# Patient Record
Sex: Male | Born: 1975 | Hispanic: No | Marital: Married | Smoking: Never smoker
Health system: Southern US, Community
[De-identification: ages and names within clinical notes are randomized; demographics above are authoritative.]

---

## 1998-08-01 ENCOUNTER — Emergency Department (HOSPITAL_COMMUNITY): Admission: EM | Admit: 1998-08-01 | Discharge: 1998-08-01 | Payer: Self-pay | Admitting: Emergency Medicine

## 1998-08-01 ENCOUNTER — Encounter: Payer: Self-pay | Admitting: Emergency Medicine

## 1998-08-07 ENCOUNTER — Emergency Department (HOSPITAL_COMMUNITY): Admission: EM | Admit: 1998-08-07 | Discharge: 1998-08-07 | Payer: Self-pay | Admitting: Emergency Medicine

## 2016-01-28 ENCOUNTER — Ambulatory Visit (INDEPENDENT_AMBULATORY_CARE_PROVIDER_SITE_OTHER): Payer: Self-pay | Admitting: Family Medicine

## 2016-01-28 VITALS — BP 118/72 | HR 50 | Temp 98.4°F | Resp 16 | Ht 68.0 in | Wt 178.0 lb

## 2016-01-28 DIAGNOSIS — Z113 Encounter for screening for infections with a predominantly sexual mode of transmission: Secondary | ICD-10-CM

## 2016-01-28 DIAGNOSIS — Z1389 Encounter for screening for other disorder: Secondary | ICD-10-CM

## 2016-01-28 DIAGNOSIS — Z1383 Encounter for screening for respiratory disorder NEC: Secondary | ICD-10-CM

## 2016-01-28 DIAGNOSIS — Z136 Encounter for screening for cardiovascular disorders: Secondary | ICD-10-CM

## 2016-01-28 DIAGNOSIS — Z130 Encounter for screening for diseases of the blood and blood-forming organs and certain disorders involving the immune mechanism: Secondary | ICD-10-CM

## 2016-01-28 DIAGNOSIS — Z1329 Encounter for screening for other suspected endocrine disorder: Secondary | ICD-10-CM

## 2016-01-28 DIAGNOSIS — Z0000 Encounter for general adult medical examination without abnormal findings: Secondary | ICD-10-CM

## 2016-01-28 LAB — CBC
HCT: 49.7 % (ref 38.5–50.0)
Hemoglobin: 17.3 g/dL — ABNORMAL HIGH (ref 13.2–17.1)
MCH: 29.9 pg (ref 27.0–33.0)
MCHC: 34.8 g/dL (ref 32.0–36.0)
MCV: 85.8 fL (ref 80.0–100.0)
MPV: 11.2 fL (ref 7.5–12.5)
Platelets: 201 10*3/uL (ref 140–400)
RBC: 5.79 MIL/uL (ref 4.20–5.80)
RDW: 13.6 % (ref 11.0–15.0)
WBC: 4.2 10*3/uL (ref 3.8–10.8)

## 2016-01-28 LAB — COMPREHENSIVE METABOLIC PANEL
ALT: 53 U/L — ABNORMAL HIGH (ref 9–46)
AST: 23 U/L (ref 10–40)
Albumin: 4.6 g/dL (ref 3.6–5.1)
Alkaline Phosphatase: 58 U/L (ref 40–115)
BUN: 17 mg/dL (ref 7–25)
CO2: 29 mmol/L (ref 20–31)
Calcium: 9.3 mg/dL (ref 8.6–10.3)
Chloride: 103 mmol/L (ref 98–110)
Creat: 0.96 mg/dL (ref 0.60–1.35)
Glucose, Bld: 89 mg/dL (ref 65–99)
Potassium: 4.4 mmol/L (ref 3.5–5.3)
Sodium: 140 mmol/L (ref 135–146)
Total Bilirubin: 0.5 mg/dL (ref 0.2–1.2)
Total Protein: 7.1 g/dL (ref 6.1–8.1)

## 2016-01-28 LAB — POCT URINALYSIS DIP (MANUAL ENTRY)
Bilirubin, UA: NEGATIVE
Blood, UA: NEGATIVE
Glucose, UA: NEGATIVE
Ketones, POC UA: NEGATIVE
Leukocytes, UA: NEGATIVE
Nitrite, UA: NEGATIVE
Protein Ur, POC: NEGATIVE
Spec Grav, UA: 1.01
Urobilinogen, UA: 0.2
pH, UA: 7

## 2016-01-28 LAB — TSH: TSH: 1.54 mIU/L (ref 0.40–4.50)

## 2016-01-28 LAB — LIPID PANEL
Cholesterol: 185 mg/dL (ref <200)
HDL: 49 mg/dL (ref >40)
LDL Cholesterol: 115 mg/dL — ABNORMAL HIGH (ref <100)
Total CHOL/HDL Ratio: 3.8 Ratio (ref <5.0)
Triglycerides: 104 mg/dL (ref <150)
VLDL: 21 mg/dL (ref <30)

## 2016-01-28 NOTE — Progress Notes (Signed)
By signing my name below, I, Mesha Guinyard, attest that this documentation has been prepared under the direction and in the presence of Norberto SorensonEva Shaw, MD.  Electronically Signed: Arvilla MarketMesha Guinyard, Medical Scribe. 01/28/16. 10:12 AM.  Subjective:    Patient ID: Robert Jordan, male    DOB: 04-11-1975, 40 y.o.   MRN: [id number]  HPI Chief Complaint  Patient presents with  . Annual Exam  . Labs Only    HPI Comments: Robert HearingRamon Arreola Levitan is a 40 y.o. male who presents to the [facility] for his complete physical. Pt's native language is Spanish so he brought in a friend to translate for him.  Pt takes OTC fish-oil supplements. Denies trouble urinating and FHx of medical problems. Pt is fasting.  Nocturia: 3x and mentions he drinks a lot of water before bed.  Immunizations: Reports his tetanus shot was done within the past 10 years and deferred the flu shot.  There is no immunization history on file for this patient.  Exercise: He exercises. Denies experiencing chest pain, and palpitations.  Depression Screening: Depression screen [facility] 2/9 01/28/2016  Decreased Interest 0  Down, Depressed, Hopeless 0  PHQ - 2 Score 0    There are no active problems to display for this patient.  No past medical history on file. No past surgical history on file. No Known Allergies Prior to Admission medications   Not on File   Social History   Social History  . Marital status: Single    Spouse name: N/A  . Number of children: N/A  . Years of education: N/A   Occupational History  . Not on file.   Social History Main Topics  . Smoking status: Never Smoker  . Smokeless tobacco: Not on file  . Alcohol use Not on file  . Drug use: Unknown  . Sexual activity: Not on file   Other Topics Concern  . Not on file   Social History Narrative  . No narrative on file   No family history on file.  Review of Systems  13 point ROS was negative. Objective:  Physical Exam   Constitutional: He appears well-developed and well-nourished. No distress.  HENT:  Head: Normocephalic and atraumatic.  Right Ear: Tympanic membrane normal.  Left Ear: Tympanic membrane normal.  Nose: Nose normal.  Mouth/Throat: Oropharynx is clear and moist.  Eyes: Conjunctivae are normal.  Neck: Neck supple. No thyroid mass and no thyromegaly present.  Cardiovascular: Normal rate, regular rhythm, S1 normal, S2 normal and normal heart sounds.  Exam reveals no gallop and no friction rub.   No murmur heard. Pulses:      Dorsalis pedis pulses are 2+ on the right side, and 2+ on the left side.  Pulmonary/Chest: Effort normal and breath sounds normal. No respiratory distress. He has no wheezes. He has no rales.  Abdominal: Soft. Bowel sounds are normal. He exhibits no distension. There is no hepatosplenomegaly. There is no tenderness.  Lymphadenopathy:    He has no cervical adenopathy.  Neurological: He is alert.  Reflex Scores:      Patellar reflexes are 2+ on the right side and 2+ on the left side.      Achilles reflexes are 2+ on the right side and 2+ on the left side. Skin: Skin is warm and dry.  Psychiatric: He has a normal mood and affect. His behavior is normal.  Nursing note and vitals reviewed.  BP 118/72   Pulse (!) 50  Temp 98.4 F (36.9 C) (Oral)   Resp 16   Ht 5\' 8"  (1.727 m)   Wt 178 lb (80.7 kg)   SpO2 98%   BMI 27.06 kg/m    Results for orders placed or performed in visit on 01/28/16  POCT urinalysis dipstick  Result Value Ref Range   Color, UA yellow yellow   Clarity, UA clear clear   Glucose, UA negative negative   Bilirubin, UA negative negative   Ketones, POC UA negative negative   Spec Grav, UA 1.010    Blood, UA negative negative   pH, UA 7.0    Protein Ur, POC negative negative   Urobilinogen, UA 0.2    Nitrite, UA Negative Negative   Leukocytes, UA Negative Negative   Assessment & Plan:   1. Annual physical exam   2. Routine screening for  STI (sexually transmitted infection)   3. Screening for cardiovascular, respiratory, and genitourinary diseases - declines ekg  4. Screening for deficiency anemia   5. Screening for thyroid disorder   6. Screening for endocrine disorder - pt requests testosterone screen.  Declines flu shot  Orders Placed This Encounter  Procedures  . GC/Chlamydia Probe Amp  . CBC  . Comprehensive metabolic panel    Order Specific Question:   Has the patient fasted?    Answer:   Yes  . TSH  . Lipid panel    Order Specific Question:   Has the patient fasted?    Answer:   Yes  . Testosterone Total,Free,Bio, Males  . RPR  . HIV antibody  . Hepatitis C Antibody  . Care order/instruction:    AVS and GO    Scheduling Instructions:     AVS and GO  . POCT urinalysis dipstick    I personally performed the services described in this documentation, which was scribed in my presence. The recorded information has been reviewed and considered, and addended by me as needed.   Norberto SorensonEva Shaw, M.D.  [facility]  [place] [address] [place], [place] [address] [phone] phone [phone] fax  01/28/16 10:58 PM

## 2016-01-28 NOTE — Patient Instructions (Addendum)
IF you received an x-ray today, you will receive an invoice from [facility]. Please contact [facility] at [phone] with questions or concerns regarding your invoice.   IF you received labwork today, you will receive an invoice from [facility]. Please contact [facility] at [phone] with questions or concerns regarding your invoice.   Our billing staff will not be able to assist you with questions regarding bills from these companies.  You will be contacted with the lab results as soon as they are available. The fastest way to get your results is to activate your My Chart account. Instructions are located on the last page of this paperwork. If you have not heard from us regarding the results in 2 weeks, please contact this office.     Mantenimiento de Engineer, civil (consulting)la salud en los hombres (Health Maintenance, Male) Un estilo de vida saludable y los cuidados preventivos son importantes para la salud y Counsellorel bienestar. Pregntele al mdico cul es el cronograma de exmenes peridicos adecuado para usted. QU DEBO SABER SOBRE EL PESO Y LA DIETA?  Consuma una dieta saludable   Coma muchas verduras, frutas, cereales integrales, productos lcteos con bajo contenido de grasa y Associate Professorprotenas magras.  No consuma muchos alimentos de alto contenido de grasas slidas, azcares agregados o sal. Mantenga un peso saludable  La actividad fsica habitual puede ayudarlo a Baristaalcanzar o mantener un peso saludable. Deber hacer lo siguiente:  Realizar al menos 150minutos de actividad fsica por semana. El ejercicio debe aumentar la frecuencia cardaca y Development worker, international aidprovocar la transpiracin (ejercicio de Monmouthintensidad moderada).  Hacer ejercicios de entrenamiento de fuerza por lo Rite Aidmenos dos veces por semana. Controlarse los niveles de colesterol y lpidos en la sangre   Hgase anlisis de sangre para controlar los lpidos y el colesterol cada 5aos a partir de los 35aos. Si tiene un  riesgo alto de Warehouse managertener cardiopatas coronarias, debe comenzar a Assuranthacerse anlisis de Edmundsonsangre a los [place]20aos. Es posible que Insurance underwriternecesite controlar los niveles de colesterol con mayor frecuencia si:  Sus niveles de lpidos y colesterol son altos.  Es mayor de 50aos.  Tiene un riesgo alto de tener cardiopatas coronarias. QU DEBO SABER SOBRE LAS PRUEBAS DE DETECCIN DEL CNCER? Muchos tipos de cncer se pueden detectar de manera temprana y a menudo prevenirse. Cncer de pulmn   Debe someterse a pruebas de deteccin de cncer de pulmn todos los aos en los siguientes casos:  Si fuma actualmente y lo ha hecho durante por lo menos 30aos.  Si fue fumador que dej el hbito en el trmino de los ltimos 15aos.  Hable con el mdico sobre las opciones en relacin con los estudios de deteccin, cundo debe comenzar a Actuaryhacrselos y con Engineer, structuralqu frecuencia. Cncer colorrectal   Generalmente, las pruebas de deteccin habituales del cncer colorrectal comienzan a los 50aos y deben repetirse cada 5 a 10aos hasta los 75aos. Es posible que tenga que hacerse las pruebas con mayor frecuencia si se detectan formas tempranas de plipos precancerosos o pequeos bultos. Sin embargo, el mdico podr aconsejarle que lo haga antes, si tiene factores de riesgo para el cncer de colon.  El mdico puede recomendarle que use kits de prueba caseros para Recruitment consultanthallar sangre oculta en la materia fecal.  Se puede usar una pequea cmara en el extremo de un tubo para examinar el colon (sigmoidoscopia o colonoscopia). Este estudio PPG Industriesdetecta las formas ms tempranas de Building services engineercncer colorrectal. Cncer de prstata y de testculo   En funcin de la  edad y del estado de salud general, el mdico puede realizarle determinados estudios de deteccin del cncer de prstata y de testculo.  Hable con el mdico sobre cualquier sntoma o acerca de las inquietudes que tenga sobre el cncer de prstata o de testculo. Cncer de piel   Revise la  piel de la cabeza a los pies con regularidad.  Informe al mdico si aparecen nuevos lunares o si nota cambios en los que ya tiene, especialmente en estos casos:  Si hay un cambio en el tamao, la forma o el color del lunar.  Si tiene un lunar que es ms grande que el tamao de una goma de Paramediclpiz.  Siempre use pantalla solar. Aplquese pantalla solar de Barth Kirksmanera generosa y repetida a lo largo del Futures traderda.  Use mangas y Automatic Datapantalones largos, un sombrero de ala ancha y gafas para el sol cuando est al Guadalupe Dawnaire libre, para protegerse. QU DEBO SABER SOBRE LAS CARDIOPATAS CORONARIAS, LA DIABETES Y LA HIPERTENSIN ARTERIAL?  Si usted tiene entre 18 y 39aos, debe medirse la presin arterial cada 3a 5aos. Si usted tiene 40aos o ms, debe medirse la presin arterial Allied Waste Industriestodos los aos. Debe medirse la presin arterial dos veces: una vez cuando est en un hospital o una clnica y la otra vez cuando est en otro sitio. Registre el promedio de Johnson Controlslas dos mediciones. Para controlar su presin arterial cuando no est en un hospital o Paulita Cradleuna clnica, puede usar lo siguiente:  Neomia DearUna mquina automtica para medir la presin arterial en una farmacia.  Un monitor para medir la presin arterial en el hogar.  Hable con el mdico Lowe's Companiessobre los valores ideales de la presin arterial.  Si tiene entre 45 y 79aos, consltele al mdico si debe tomar aspirina para evitar las cardiopatas coronarias.  Hgase anlisis habituales de deteccin de la diabetes; para ello, contrlese la glucemia en ayunas.  Si su peso es normal y tiene un bajo riesgo de padecer diabetes, realcese este anlisis cada tres aos despus de los 45aos.  Si tiene sobrepeso y un alto riesgo de padecer diabetes, considere someterse a este anlisis antes o con mayor frecuencia.  Para los hombres que tienen entre 65 y 5875aos, y son o han sido fumadores, se recomienda un nico estudio con ecografa para Engineer, manufacturingdetectar un aneurisma de aorta abdominal (AAA). QU DEBO  SABER SOBRE LA PREVENCIN DE LAS INFECCIONES? HepatitisB  Si tiene un riesgo ms alto de Primary school teachercontraer hepatitis B, debe someterse a un examen de deteccin de este virus. Hable con el mdico para determinar si corre riesgo de tener una infeccin por hepatitisB. Hepatitis C  Se recomienda un anlisis de Flushingsangre para:  Todos los que nacieron entre 1945 y [phone].  Todas las personas que tengan un riesgo de haber contrado hepatitis C. Enfermedades de transmisin sexual (ETS)   Debe realizarse pruebas de Airline pilotdeteccin de las ETS todos los aos, incluidas la gonorrea y la clamidia, en estos casos:  Es sexualmente activo y es menor de [place]24aos.  Es mayor de 24aos, y Public affairs consultantel mdico le informa que corre riesgo de tener este tipo de infecciones.  La actividad sexual ha cambiado desde que le hicieron la ltima prueba de deteccin y tiene un riesgo mayor de Warehouse managertener clamidia o Copygonorrea. Pregntele al mdico si usted tiene riesgo.  Consulte a su mdico para saber si tiene un alto riesgo de infectarse por el VIH. El mdico puede recomendarle un medicamento de venta con receta para ayudar a evitar la infeccin por el VIH. QU  MS PUEDO HACER?  Realcese los estudios de rutina de la salud, dentales y de Wellsite geologist.  Mantngase al da con las vacunas (inmunizaciones).  No consuma ningn producto que contenga tabaco, lo que incluye cigarrillos, tabaco de Theatre manager y Administrator, Civil Service. Si necesita ayuda para dejar de fumar, consulte al mdico.  Limite el consumo de alcohol a no ms de por da. BorgWarner a 12 onzas de cerveza, 5onzas de vino o 1onzas de bebidas alcohlicas de alta graduacin.  No consuma drogas.  No comparta agujas.  Solicite ayuda a su mdico si necesita apoyo o informacin para abandonar las drogas.  Informe a su mdico si a menudo se siente deprimido.  Notifique a su mdico si alguna vez ha sido vctima de abuso o si no se siente seguro en su hogar. Esta informacin  no tiene Theme park manager el consejo del mdico. Asegrese de hacerle al mdico cualquier pregunta que tenga. Document Released: 08/19/2007 Document Revised: 03/13/2014 Document Reviewed: 11/24/2014 Elsevier Interactive Patient Education  2017 [employer].

## 2016-01-29 LAB — GC/CHLAMYDIA PROBE AMP
CT Probe RNA: NOT DETECTED
GC Probe RNA: NOT DETECTED

## 2016-01-29 LAB — HEPATITIS C ANTIBODY: HCV AB: NEGATIVE

## 2016-01-29 LAB — RPR

## 2016-01-29 LAB — HIV ANTIBODY (ROUTINE TESTING W REFLEX): HIV 1&2 Ab, 4th Generation: NONREACTIVE

## 2016-01-31 LAB — TESTOSTERONE TOTAL,FREE,BIO, MALES
Albumin: 4.6 g/dL (ref 3.6–5.1)
Sex Hormone Binding: 50 nmol/L (ref 10–50)
Testosterone, Bioavailable: 66.5 ng/dL — ABNORMAL LOW (ref 110.0–575.0)
Testosterone, Free: 31.7 pg/mL — ABNORMAL LOW (ref 46.0–224.0)
Testosterone: 352 ng/dL (ref 250–827)

## 2016-02-04 ENCOUNTER — Telehealth: Payer: Self-pay | Admitting: Emergency Medicine

## 2016-02-04 NOTE — Telephone Encounter (Signed)
Patient came in for lab results and Dr. Clelia CroftShaw is out of the office this week.  Per Maralyn SagoSarah after reviewing- Advised results look good and no treatment needed at this time.  Advised- Dr. Clelia CroftShaw will address low testosterone levels  Verbalized understanding and copy given

## 2016-02-14 ENCOUNTER — Encounter: Payer: Self-pay | Admitting: Family Medicine

## 2016-09-15 ENCOUNTER — Encounter: Payer: Self-pay | Admitting: Physician Assistant

## 2016-09-15 ENCOUNTER — Ambulatory Visit (INDEPENDENT_AMBULATORY_CARE_PROVIDER_SITE_OTHER): Payer: Self-pay | Admitting: Physician Assistant

## 2016-09-15 VITALS — BP 121/75 | HR 80 | Temp 98.2°F | Resp 18 | Ht 68.0 in | Wt 180.2 lb

## 2016-09-15 DIAGNOSIS — J029 Acute pharyngitis, unspecified: Secondary | ICD-10-CM

## 2016-09-15 LAB — POCT RAPID STREP A (OFFICE): Rapid Strep A Screen: NEGATIVE

## 2016-09-15 MED ORDER — FIRST-DUKES MOUTHWASH MT SUSP: 5.0000 mL | Freq: Four times a day (QID) | OROMUCOSAL | 0 refills | Status: DC | PRN

## 2016-09-15 NOTE — Progress Notes (Signed)
   Vickki HearingRamon Arreola Abila  MRN: [id number] DOB: 1975-07-21  PCP: Patient, No Pcp Per  Subjective:  Pt is a 41 year old male who presents to clinic for sore throat x 1 day. Painful to swallow. 6/10 pain.  Endorses fatigue.  He didn't sleep well last night due to throat pain. He tried NyQuil, helped some.  Denies fever, chills, cough, n/v, diaphoresis, difficulty breathing.   Review of Systems  Constitutional: Positive for fatigue. Negative for chills, diaphoresis and fever.  HENT: Positive for sore throat. Negative for congestion, ear discharge, ear pain, postnasal drip and rhinorrhea.   Respiratory: Negative for cough.   Skin: Negative for rash.    There are no active problems to display for this patient.   No current outpatient prescriptions on file prior to visit.   No current facility-administered medications on file prior to visit.     No Known Allergies   Objective:  BP 121/75 (BP Location: Right Arm, Patient Position: Sitting, Cuff Size: Large)   Pulse 80   Temp 98.2 F (36.8 C) (Oral)   Resp 18   Ht 5\' 8"  (1.727 m)   Wt 180 lb 3.2 oz (81.7 kg)   SpO2 97%   BMI 27.40 kg/m   Physical Exam  Constitutional: He is oriented to person, place, and time and well-developed, well-nourished, and in no distress. No distress.  HENT:  Right Ear: Tympanic membrane is scarred.  Left Ear: Tympanic membrane normal.  Mouth/Throat: Uvula is midline and mucous membranes are normal. Posterior oropharyngeal erythema present. No oropharyngeal exudate or posterior oropharyngeal edema.  Cardiovascular: Normal rate, regular rhythm and normal heart sounds.   Neurological: He is alert and oriented to person, place, and time. GCS score is 15.  Skin: Skin is warm and dry.  Psychiatric: Mood, memory, affect and judgment normal.  Vitals reviewed.  Results for orders placed or performed in visit on 09/15/16  POCT rapid strep A  Result Value Ref Range   Rapid Strep A Screen Negative Negative     Assessment and Plan :  1. Acute pharyngitis, unspecified etiology 2. Sore throat - POCT rapid strep A - Culture, Group A Strep - Diphenhyd-Hydrocort-Nystatin (FIRST-DUKES MOUTHWASH) SUSP; Use as directed 5 mLs in the mouth or throat every 6 (six) hours as needed. Gargle and spit out  Dispense: 237 mL; Refill: 0 - Rapid strep is negative. Culture is pending. Suspect viral pharyngitis. Will treat supportively. RTC in 5-7 days if no improvement.   Marco CollieWhitney Aimy Sweeting, PA-C  [facility] 09/15/2016 12:27 PM

## 2016-09-15 NOTE — Patient Instructions (Addendum)
Drink warm tea with honey. See below for home care tips.  400-681m Ibuprofen every 6 hours for pain.  Gargle and spit Duke's magic mouthwash.  Come back if you are not better in 5-7 days.   Thank you for coming in today. I hope you feel we met your needs.  Feel free to call [facility] if you have any questions or further requests.  Please consider signing up for MyChart if you do not already have it, as this is a great way to communicate with me.  Best,  Whitney McVey, PA-C   Sore Throat When you have a sore throat, your throat may:  Hurt.  Burn.  Feel irritated.  Feel scratchy.  Many things can cause a sore throat, including:  An infection.  Allergies.  Dryness in the air.  Smoke or pollution.  Gastroesophageal reflux disease (GERD).  A tumor.  A sore throat can be the first sign of another sickness. It can happen with other problems, like coughing or a fever. Most sore throats go away without treatment. Follow these instructions at home:  Take over-the-counter medicines only as told by your doctor.  Drink enough fluids to keep your pee (urine) clear or pale yellow.  Rest when you feel you need to.  To help with pain, try: ? Sipping warm liquids, such as broth, herbal tea, or warm water. ? Eating or drinking cold or frozen liquids, such as frozen ice pops. ? Gargling with a salt-water mixture 3-4 times a day or as needed. To make a salt-water mixture, add -1 tsp of salt in 1 cup of warm water. Mix it until you cannot see the salt anymore. ? Sucking on hard candy or throat lozenges. ? Putting a cool-mist humidifier in your bedroom at night. ? Sitting in the bathroom with the door closed for 5-10 minutes while you run hot water in the shower.  Do not use any tobacco products, such as cigarettes, chewing tobacco, and e-cigarettes. If you need help quitting, ask your doctor. Contact a doctor if:  You have a fever for more than 2-3 days.  You keep having symptoms  for more than 2-3 days.  Your throat does not get better in 7 days.  You have a fever and your symptoms suddenly get worse. Get help right away if:  You have trouble breathing.  You cannot swallow fluids, soft foods, or your saliva.  You have swelling in your throat or neck that gets worse.  You keep feeling like you are going to throw up (vomit).  You keep throwing up. This information is not intended to replace advice given to you by your health care provider. Make sure you discuss any questions you have with your health care provider. Document Released: 11/30/2007 Document Revised: 10/17/2015 Document Reviewed: 12/11/2014 Elsevier Interactive Patient Education  2018 [employer]  IF you received an x-ray today, you will receive an invoice from [facility]. Please contact [facility] at [phone] questions or concerns regarding your invoice.   IF you received labwork today, you will receive an invoice from [facility] Please contact [facility] at [phone] questions or concerns regarding your invoice.   Our billing staff will not be able to assist you with questions regarding bills from these companies.  You will be contacted with the lab results as soon as they are available. The fastest way to get your results is to activate your My Chart account. Instructions are located on the last page of this paperwork. If  you have not heard from [place] regarding the results in 2 weeks, please contact this office.

## 2016-09-18 LAB — CULTURE, GROUP A STREP

## 2016-09-18 LAB — PLEASE NOTE

## 2016-09-18 NOTE — Progress Notes (Signed)
Spoke with pt on phone. He is feeling better. Will not treat.

## 2017-06-10 ENCOUNTER — Encounter (HOSPITAL_COMMUNITY): Payer: Self-pay

## 2017-06-10 ENCOUNTER — Other Ambulatory Visit: Payer: Self-pay

## 2017-06-10 ENCOUNTER — Emergency Department (HOSPITAL_COMMUNITY)
Admission: EM | Admit: 2017-06-10 | Discharge: 2017-06-10 | Disposition: A | Discharge status: Home / Self Care | Payer: Self-pay | Attending: Emergency Medicine | Admitting: Emergency Medicine

## 2017-06-10 DIAGNOSIS — R1011 Right upper quadrant pain: Secondary | ICD-10-CM | POA: Insufficient documentation

## 2017-06-10 DIAGNOSIS — R109 Unspecified abdominal pain: Secondary | ICD-10-CM

## 2017-06-10 DIAGNOSIS — R7989 Other specified abnormal findings of blood chemistry: Secondary | ICD-10-CM | POA: Insufficient documentation

## 2017-06-10 LAB — COMPREHENSIVE METABOLIC PANEL
ALBUMIN: 4 g/dL (ref 3.5–5.0)
ALT: 26 U/L (ref 17–63)
ANION GAP: 8 (ref 5–15)
AST: 19 U/L (ref 15–41)
Alkaline Phosphatase: 79 U/L (ref 38–126)
BUN: 21 mg/dL — ABNORMAL HIGH (ref 6–20)
CHLORIDE: 105 mmol/L (ref 101–111)
CO2: 24 mmol/L (ref 22–32)
Calcium: 9.1 mg/dL (ref 8.9–10.3)
Creatinine, Ser: 1.33 mg/dL — ABNORMAL HIGH (ref 0.61–1.24)
GFR calc Af Amer: >60 mL/min (ref >60)
GFR calc non Af Amer: >60 mL/min (ref >60)
GLUCOSE: 104 mg/dL — AB (ref 65–99)
POTASSIUM: 4 mmol/L (ref 3.5–5.1)
SODIUM: 137 mmol/L (ref 135–145)
TOTAL PROTEIN: 6.7 g/dL (ref 6.5–8.1)
Total Bilirubin: 0.7 mg/dL (ref 0.3–1.2)

## 2017-06-10 LAB — CBC
HEMATOCRIT: 44.9 % (ref 39.0–52.0)
HEMOGLOBIN: 15.4 g/dL (ref 13.0–17.0)
MCH: 28.7 pg (ref 26.0–34.0)
MCHC: 34.3 g/dL (ref 30.0–36.0)
MCV: 83.8 fL (ref 78.0–100.0)
Platelets: 189 10*3/uL (ref 150–400)
RBC: 5.36 MIL/uL (ref 4.22–5.81)
RDW: 13 % (ref 11.5–15.5)
WBC: 5 10*3/uL (ref 4.0–10.5)

## 2017-06-10 LAB — LIPASE, BLOOD: LIPASE: 41 U/L (ref 11–51)

## 2017-06-10 LAB — URINALYSIS, ROUTINE W REFLEX MICROSCOPIC
BILIRUBIN URINE: NEGATIVE
GLUCOSE, UA: NEGATIVE mg/dL
Hgb urine dipstick: NEGATIVE
Ketones, ur: NEGATIVE mg/dL
Leukocytes, UA: NEGATIVE
NITRITE: NEGATIVE
PH: 6 (ref 5.0–8.0)
Protein, ur: NEGATIVE mg/dL
SPECIFIC GRAVITY, URINE: 1.017 (ref 1.005–1.030)

## 2017-06-10 MED ORDER — ONDANSETRON HCL 4 MG/2ML IJ SOLN
4.0000 mg | Freq: Once | INTRAMUSCULAR | Status: AC
  Administered 2017-06-10: 4 mg via INTRAVENOUS
  Filled 2017-06-10: qty 2

## 2017-06-10 MED ORDER — SODIUM CHLORIDE 0.9 % IV BOLUS: 1000.0000 mL | Freq: Once | INTRAVENOUS | Status: DC

## 2017-06-10 MED ORDER — SODIUM CHLORIDE 0.9 % IV BOLUS
2000.0000 mL | Freq: Once | INTRAVENOUS | Status: AC
  Administered 2017-06-10: 2000 mL via INTRAVENOUS

## 2017-06-10 MED ORDER — ACETAMINOPHEN 500 MG PO TABS
1000.0000 mg | ORAL_TABLET | Freq: Once | ORAL | Status: AC
  Administered 2017-06-10: 1000 mg via ORAL
  Filled 2017-06-10: qty 2

## 2017-06-10 NOTE — ED Notes (Signed)
Patient verbalizes understanding of discharge instructions. Opportunity for questioning and answers were provided. Armband removed by staff, pt discharged from ED.  

## 2017-06-10 NOTE — ED Triage Notes (Signed)
Pt reports RUQ pain X3 days. Denies n/v/d. Skin warm and dry, no distress noted.

## 2017-06-10 NOTE — Discharge Instructions (Signed)
Tome acetaminofn (Tylenol) hasta 975 mg (esto es normalmente 3 pastillas de venta libre) hasta 3 veces al Futures traderda. No tomes alcohol. Asegrese de que sus otros medicamentos no contengan acetaminofn (Lea las etiquetas!)  Sus pruebas de rin hoy no son normales. Necesita beber Iranmucha agua y su mdico de atencin primaria lo revisar la prxima semana. No tome ningn AINE (motrin, ibuprofen, advil, aleve, excedrin, aspirin, naproxen, goody en polvo, etc.) porque pueden daar an ms sus riones.  Por favor, siga con su mdico de atencin primaria en los prximos 2 das para un chequeo. Deben obtener registros para su posterior manejo.  No dude en regresar al Departamento de Emergencias por cualquier sntoma nuevo, que empeore o relacionado con los sntomas.  Take acetaminophen (Tylenol) up to 975 mg (this is normally 3 over-the-counter pills) up to 3 times a day. Do not drink alcohol. Make sure your other medications do not contain acetaminophen (Read the labels!)  Your kidney tests today are not normal. You need to drink plenty of water and have this rechecked by your primary care doctor in the next week. Do not take any NSAIDs (motrin, ibuprofen, advil, aleve, excedrin, aspirin, naproxen, goody's powder,  etc.) because they can further hurt your kidneys.   Please follow with your primary care doctor in the next 2 days for a check-up. They must obtain records for further management.   Do not hesitate to return to the Emergency Department for any new, worsening or concerning symptoms.

## 2017-06-11 NOTE — ED Provider Notes (Signed)
[facility] EMERGENCY DEPARTMENT Provider Note   CSN: [id number] Arrival date & time: 06/10/17  1813     History   Chief Complaint Chief Complaint  Patient presents with  . Abdominal Pain    HPI    Garlen Reinig is a 42 y.o. male significant past medical history complaining of right upper quadrant pain worsening over the course of the last 3 days, it is exacerbated by walking. It is not exacerbated postprandially. He has had a normal appetite, no nausea, vomiting, change in bowel or bladder habits, fever, chills, chest pain, cough, shortness of breath or dizziness. He states that the pain is moderate. He is not taken any pain medication prior to arrival, he does not drink regularly, he does not take regular NSAIDs.   Communication via Education administrator    History reviewed. No pertinent past medical history.  There are no active problems to display for this patient.   History reviewed. No pertinent surgical history.      Home Medications    Prior to Admission medications   Medication Sig Start Date End Date Taking? Authorizing Provider  Diphenhyd-Hydrocort-Nystatin (FIRST-DUKES MOUTHWASH) SUSP Use as directed 5 mLs in the mouth or throat every 6 (six) hours as needed. Gargle and spit out 09/15/16   McVey, Madelaine Bhat, PA-C    Family History Family History  Problem Relation Age of Onset  . Depression Mother   . Gout Father   . Osteoporosis Maternal Grandmother     Social History Social History   Tobacco Use  . Smoking status: Never Smoker  . Smokeless tobacco: Never Used  Substance Use Topics  . Alcohol use: Yes    Alcohol/week: 1.2 oz    Types: 2 Cans of beer per week    Comment: occassionally  . Drug use: No     Allergies   Patient has no known allergies.   Review of Systems Review of Systems  A complete review of systems was obtained and all systems are negative except as noted in the HPI and PMH.   Physical  Exam Updated Vital Signs BP 121/71   Pulse (!) 48   Temp 97.9 F (36.6 C) (Oral)   Resp 14   Ht 5\' 8"  (1.727 m)   Wt 78.9 kg (174 lb)   SpO2 100%   BMI 26.46 kg/m   Physical Exam Constitutional: He is oriented to person, place, and time. He appears well-developed and well-nourished. No distress.  HENT:  Head: Normocephalic and atraumatic.  Mouth/Throat: Oropharynx is clear and moist.  Eyes: Pupils are equal, round, and reactive to light. Conjunctivae and EOM are normal.  Neck: Normal range of motion.  Cardiovascular: Normal rate, regular rhythm and intact distal pulses.  Pulmonary/Chest: Effort normal and breath sounds normal.  Abdominal: Soft. He exhibits no distension and no mass. There is no tenderness. There is no rebound and no guarding. No hernia.  No rash, no significant tenderness to palpation of any quadrant. No guarding or rebound  Musculoskeletal: Normal range of motion.  Neurological: He is alert and oriented to person, place, and time.  Skin: He is not diaphoretic.  Psychiatric: He has a normal mood and affect.   ED Treatments / Results  Labs (all labs ordered are listed, but only abnormal results are displayed) Labs Reviewed  COMPREHENSIVE METABOLIC PANEL - Abnormal; Notable for the following components:      Result Value   Glucose, Bld 104 (*)    BUN 21 (*)  Creatinine, Ser 1.33 (*)    All other components within normal limits  URINALYSIS, ROUTINE W REFLEX MICROSCOPIC - Abnormal; Notable for the following components:   Color, Urine STRAW (*)    All other components within normal limits  LIPASE, BLOOD  CBC    EKG None  Radiology No results found.  Procedures Procedures (including critical care time)  Medications Ordered in ED Medications  ondansetron (ZOFRAN) injection 4 mg (4 mg Intravenous Given 06/10/17 2141)  acetaminophen (TYLENOL) tablet 1,000 mg (1,000 mg Oral Given 06/10/17 2141)  sodium chloride 0.9 % bolus 2,000 mL (0 mLs Intravenous  Stopped 06/10/17 2258)     Initial Impression / Assessment and Plan / ED Course  I have reviewed the triage vital signs and the nursing notes.  Pertinent labs & imaging results that were available during my care of the patient were reviewed by me and considered in my medical decision making (see chart for details).     Vickki HearingRamon Arreola Podolski is 42 y.o. male presenting with pain in the right upper quadrant exacerbated by walking onset 3 days ago with no associated nausea, vomiting, diarrhea, chest pain or shortness of breath. Patient is bradycardic, he states that he rec exercises regularly, chart review shows he has been bradycardic in the past, he has no weakness or palpitations. His abdominal exam is benign. Blood work reassuring. No other associated symptoms this to suggest an acute intra-abdominal pathology. This may be a muscle strain. No rash to suggest a zoster. Counseled patient to take acetaminophen for pain control. He has a mild AKI, he does not regularly take NSAIDs. I have advised him to push fluids, he will be given 2 L in the ED. He understands he has to follow with his primary care for recheck of his renal function in the next week. Patient verbalized understanding and teach back technique.   Evaluation does not show pathology that would require ongoing emergent intervention or inpatient treatment. Pt is hemodynamically stable and mentating appropriately. Discussed findings and plan with patient/guardian, who agrees with care plan. All questions answered. Return precautions discussed and outpatient follow up given.    Final Clinical Impressions(s) / ED Diagnoses   Final diagnoses:  Abdominal wall pain  Elevated serum creatinine    ED Discharge Orders    None       Lynetta Mareisciotta, Mardella Laymanicole, PA-C 06/11/17 2217    Nira Connardama, Pedro Eduardo, MD 06/12/17 [phone]

## 2017-07-25 ENCOUNTER — Other Ambulatory Visit: Payer: Self-pay

## 2017-07-25 ENCOUNTER — Encounter: Payer: Self-pay | Admitting: Family Medicine

## 2017-07-25 ENCOUNTER — Ambulatory Visit (INDEPENDENT_AMBULATORY_CARE_PROVIDER_SITE_OTHER): Payer: No Typology Code available for payment source | Admitting: Family Medicine

## 2017-07-25 VITALS — BP 130/76 | HR 68 | Temp 97.9°F | Resp 18 | Ht 68.0 in | Wt 173.6 lb

## 2017-07-25 DIAGNOSIS — Z1159 Encounter for screening for other viral diseases: Secondary | ICD-10-CM

## 2017-07-25 DIAGNOSIS — Z1383 Encounter for screening for respiratory disorder NEC: Secondary | ICD-10-CM

## 2017-07-25 DIAGNOSIS — R1011 Right upper quadrant pain: Secondary | ICD-10-CM | POA: Diagnosis not present

## 2017-07-25 DIAGNOSIS — Z1329 Encounter for screening for other suspected endocrine disorder: Secondary | ICD-10-CM | POA: Diagnosis not present

## 2017-07-25 DIAGNOSIS — Z113 Encounter for screening for infections with a predominantly sexual mode of transmission: Secondary | ICD-10-CM

## 2017-07-25 DIAGNOSIS — Z0000 Encounter for general adult medical examination without abnormal findings: Secondary | ICD-10-CM

## 2017-07-25 DIAGNOSIS — Z23 Encounter for immunization: Secondary | ICD-10-CM | POA: Diagnosis not present

## 2017-07-25 DIAGNOSIS — Z136 Encounter for screening for cardiovascular disorders: Secondary | ICD-10-CM

## 2017-07-25 DIAGNOSIS — Z1389 Encounter for screening for other disorder: Secondary | ICD-10-CM | POA: Diagnosis not present

## 2017-07-25 DIAGNOSIS — Z114 Encounter for screening for human immunodeficiency virus [HIV]: Secondary | ICD-10-CM

## 2017-07-25 LAB — POCT URINALYSIS DIP (MANUAL ENTRY)
BILIRUBIN UA: NEGATIVE
GLUCOSE UA: NEGATIVE mg/dL
Ketones, POC UA: NEGATIVE mg/dL
Leukocytes, UA: NEGATIVE
Nitrite, UA: NEGATIVE
Protein Ur, POC: NEGATIVE mg/dL
RBC UA: NEGATIVE
SPEC GRAV UA: 1.015 (ref 1.010–1.025)
UROBILINOGEN UA: 0.2 U/dL
pH, UA: 7 (ref 5.0–8.0)

## 2017-07-25 MED ORDER — TRIAMCINOLONE ACETONIDE 55 MCG/ACT NA AERO: 1.0000 | INHALATION_SPRAY | Freq: Two times a day (BID) | NASAL | 1 refills | Status: DC

## 2017-07-25 NOTE — Patient Instructions (Addendum)
IF you received an x-ray today, you will receive an invoice from [facility] Radiology. Please contact [facility] Radiology at [phone] with questions or concerns regarding your invoice.   IF you received labwork today, you will receive an invoice from [place]. Please contact [facility] at [phone] with questions or concerns regarding your invoice.   Our billing staff will not be able to assist you with questions regarding bills from these companies.  You will be contacted with the lab results as soon as they are available. The fastest way to get your results is to activate your My Chart account. Instructions are located on the last page of this paperwork. If you have not heard from [place] regarding the results in 2 weeks, please contact this office.     Colelitiasis Cholelithiasis La colelitiasis es una enfermedad de la vescula biliar en la que se forman clculos biliares. La vescula biliar es un rgano que almacena bilis. La bilis se forma en el hgado y [place] a Engineer, agricultural. Los clculos comienzan como pequeos cristales y lentamente se transforman en piedras. Es posible que no presenten sntomas hasta que la vescula se endurece (contrae) y un clculo biliar bloquea un conducto (ataque de la vescula biliar) y esto puede Programmer, multimedia. La colelitiasis tambin es conocida como clculos en la vescula biliar. Hay dos tipos principales de clculos biliares:  Clculos de colesterol. Estos se forman por el colesterol endurecido y generalmente son de color amarillo verdoso. Son el tipo de clculos biliares ms frecuente. El colesterol es una sustancia blanca y cerosa parecida a la grasa que se forma en el hgado.  Clculos de pigmento. Estos son de color oscuro y estn formados por una sustancia amarillo rojiza que se forma cuando la hemoglobina de los glbulos rojos se descompone (bilirrubina).  Cules son las causas? Este trastorno puede ser causado por un desequilibrio en las  sustancias que componen la bilis. Esto puede suceder si la bilis:  Tiene mucha bilirrubina.  Tiene mucho colesterol.  No tiene suficiente sales biliares. Estas sales le ayudan al organismo a Environmental health practitioner y a Mining engineer.  En ciertos casos, esta afeccin tambin puede ser causada por una vescula biliar que no se vaca completamente o lo suficientemente seguido. Qu incrementa el riesgo? Los siguientes factores pueden hacer que usted sea ms propenso a sufrir esta afeccin:  Ser mujer.  Tener embarazos mltiples. Algunas veces los mdicos aconsejan extirpar los clculos biliares antes de futuros embarazos.  Tener una dieta con demasiadas comidas fritas, grasas y carbohidratos refinados, como el pan blanco y el arroz blanco.  Ser obeso.  Tener ms de 40 aos.  Uso prolongado de medicamentos que contienen hormonas femeninas (estrgenos).  Tener diabetes mellitus.  Prdida rpida de peso.  Antecedentes familiares de clculos biliares.  Ser descendiente de mexicanos o indios norteamericanos.  Tener una enfermedad intestinal como la enfermedad de Crohn.  Tener sndrome metablico.  Tener cirrosis.  Tener tipos graves de anemia, como la anemia drepanoctica.  Cules son los signos o los sntomas? En la [employer] no hay sntomas. Estos se denominan clculos silenciosos. Si un clculo biliar bloquea las vas biliares, puede causar un ataque de la vescula biliar. El principal sntoma de un ataque de la vescula biliar es un dolor repentino en la parte superior derecha del abdomen. El dolor aparece generalmente a la noche o despus de comer comidas abundantes. El dolor puede durar una o varias horas y se puede extender hasta el hombro derecho  o el pecho. Si el conducto biliar est bloqueado durante ms de algunas horas, puede causar infeccin o inflamacin de la vescula biliar, del hgado o del pncreas, lo que puede provocar:  Nuseas.  Vmitos.  Dolor  abdominal durante 5 horas o ms.  Fiebre o escalofros.  Coloracin amarillenta de la piel y la partes blancas de los ojos (ictericia).  Orina de color oscuro.  Heces de color claro.  Cmo se diagnostica? Esta afeccin se puede diagnosticar en funcin de lo siguiente:  Un examen fsico.  Sus antecedentes mdicos.  Una ecografa de la vescula biliar.  Exploracin por tomografa computarizada (TC).  Resonancia magntica (RM).  Un anlisis de sangre para detectar signos de infeccin o inflamacin.  Un estudio de la vescula biliar y las vas biliares (sistema biliar) que Nurse, mental health material radiactivo no daino y Therapist, occupational que pueden ver el material radiactivo (colescintigrafa). Este estudio examina cmo se contrae la vescula biliar y si las vas biliares estn bloqueadas.  Insertar un pequeo tubo con una cmara en el extremo (endoscopio) a travs de la boca para inspeccionar las vas biliares y Engineer, manufacturing bloqueos (colangiopancreatografa retrgrada endoscpica).  Cmo se trata esta afeccin? El tratamiento de los clculos biliares depende de la gravedad de la afeccin. Los clculos silenciosos no requieren TEFL teacher. Si los clculos causan un ataque de la vescula biliar u otros sntomas, puede ser Aeronautical engineer. Las opciones de tratamiento son:  Kandis Ban para extirpar la vescula biliar (colecistectoma). Es el tratamiento ms frecuente.  Medicamentos para [employer]. Estos son ms efectivos para tratar clculos pequeos. Es posible que tenga que tomar medicamentos durante 6 a 12 meses.  Tratamiento con ondas de choque (litotricia biliar extracorporal). En este tratamiento, una mquina de ultrasonido enva ondas de choque a la vescula biliar para destruir los clculos en pequeos fragmentos. Estos fragmentos luego podrn pasar a los intestinos o ser disueltos con medicamentos. Esto no se hace con frecuencia.  Extraccin de los clculos  mediante una colangiopancreatografa retrgrada endoscpica. Se utiliza una pequea cesta anexa al endoscopio para capturar y extraer los clculos.  Siga estas indicaciones en su casa:  Tome los medicamentos de venta libre y los recetados solamente como se lo haya indicado el mdico.  Mantenga un peso saludable y consuma una dieta saludable. Esto incluye lo siguiente: ? Reducir los [employer], como las comidas fritas. ? Reducir los carbohidratos refinados, como el pan blanco y el arroz blanco. ? Aumentar la cantidad de [place]. Alimentarse con alimentos como almendras, frutas y frijoles.  Concurra a todas las visitas de control como se lo haya indicado el mdico. Esto es importante. Comunquese con un mdico si:  Piensa que ha tenido un ataque de vescula biliar.  Le han diagnosticado clculos silenciosos y tiene dolor abdominal o indigestin. Solicite ayuda de inmediato si:  Tiene dolor por un ataque de vescula biliar que dura ms de 2horas.  Tiene dolor abdominal que dura ms de 5horas.  Tiene fiebre o siente escalofros.  Tiene nuseas o vmitos persistentes.  Tiene ictericia.  Orina de color oscuro o tiene heces de color plido. Resumen  La colelitiasis (tambin llamada clculos en la vescula biliar) es una enfermedad en la que se forman clculos en la vescula.  A este trastorno lo causa un desequilibrio en las sustancias que componen la bilis. Esto puede suceder si la bilis tiene demasiado colesterol, demasiada bilirrubina o cantidad insuficiente de sales biliares.  Es ms probable que padezca esta afeccin si es [place],  est embarazada, [place] medicamentos con estrgeno, es obeso, es mayor de 40 aos o tiene antecedentes familiares de clculos biliares. Tambin puede desarrollar clculos biliares si tiene diabetes, una enfermedad intestinal, cirrosis o sndrome metablico.  El tratamiento de los clculos biliares depende de la gravedad de la afeccin. Los clculos  silenciosos no requieren TEFL teacher.  Si los clculos causan un ataque de la vescula biliar u otros sntomas, puede ser necesario un tratamiento. El tratamiento ms frecuente es la [place] para extirpar la vescula biliar. Esta informacin no tiene Theme park manager el consejo del mdico. Asegrese de hacerle al mdico cualquier pregunta que tenga. Document Released: 12/07/2005 Document Revised: 05/22/2016 Document Reviewed: 08/14/2012 Elsevier Interactive Patient Education  2018 [employer].  Mantenimiento de la salud en los hombres (Health Maintenance, Male) Un estilo de vida saludable y los cuidados preventivos son importantes para la salud y Counsellor. Pregntele al mdico cul es el cronograma de exmenes peridicos adecuado para usted. QU DEBO SABER SOBRE EL PESO Y LA DIETA? Consuma una dieta saludable  Coma muchas verduras, frutas, cereales integrales, productos lcteos con bajo contenido de grasa y Associate Professor.  No consuma muchos alimentos de alto contenido de grasas slidas, azcares agregados o sal. Mantenga un peso saludable La actividad fsica habitual puede ayudarlo a Barista o mantener un peso saludable. Deber hacer lo siguiente:  Realizar al menos de actividad fsica por semana. El ejercicio debe aumentar la frecuencia cardaca y Development worker, international aid la transpiracin (ejercicio de [place]).  Hacer ejercicios de entrenamiento de fuerza por lo [employer] por semana. Controlarse los niveles de colesterol y lpidos en la sangre  Hgase anlisis de sangre para controlar los lpidos y el colesterol cada 5aos a partir de los 35aos. Si tiene un riesgo alto de Warehouse manager cardiopatas coronarias, debe comenzar a [employer] [place] a los [place]. Es posible que Insurance underwriter los niveles de colesterol con mayor frecuencia si: ? Sus niveles de lpidos y colesterol son altos. ? Es mayor de [id number]. ? Tiene un riesgo alto de tener cardiopatas  coronarias. QU DEBO SABER SOBRE LAS PRUEBAS DE DETECCIN DEL CNCER? Muchos tipos de cncer se pueden detectar de manera temprana y a menudo prevenirse. Cncer de pulmn  Debe someterse a pruebas de deteccin de cncer de pulmn todos los aos en los siguientes casos: ? Si fuma actualmente y lo ha hecho durante por lo menos 30aos. ? Si fue fumador que dej el hbito en el trmino de los ltimos 15aos.  Hable con el mdico sobre las opciones en relacin con los estudios de deteccin, cundo debe comenzar a Actuary y con Engineer, structural. Cncer colorrectal  Generalmente, las pruebas de deteccin habituales del cncer colorrectal comienzan a los 50aos y deben repetirse cada 5 a 10aos hasta los 75aos. Es posible que tenga que hacerse las pruebas con mayor frecuencia si se detectan formas tempranas de plipos precancerosos o pequeos bultos. Sin embargo, el mdico podr aconsejarle que lo haga antes, si tiene factores de riesgo para el cncer de colon.  El mdico puede recomendarle que use kits de prueba caseros para Recruitment consultant oculta en la materia fecal.  Se puede usar una pequea cmara en el extremo de un tubo para examinar el colon (sigmoidoscopia o colonoscopia). Este estudio [employer] formas ms tempranas de Building services engineer. Cncer de prstata y de testculo  En funcin de la edad y del estado de salud general, el mdico puede realizarle determinados estudios de deteccin del cncer de prstata y  de testculo.  Hable con el mdico sobre cualquier sntoma o acerca de las inquietudes que tenga sobre el cncer de prstata o de testculo. Cncer de piel  Revise la piel de la cabeza a los pies con regularidad.  Informe al mdico si aparecen nuevos lunares o si nota cambios en los que ya tiene, especialmente en estos casos: ? Si hay un cambio en el tamao, la forma o el color del lunar. ? Si tiene un lunar que es ms grande que el tamao de una goma de Paramedic.  Siempre  use pantalla solar. Aplquese pantalla solar de [place] y repetida a lo largo del Futures trader.  Use mangas y Automatic Data, un sombrero de ala ancha y gafas para el sol cuando est al [place] Dawn, para protegerse. QU DEBO SABER SOBRE LAS CARDIOPATAS CORONARIAS, LA DIABETES Y LA HIPERTENSIN ARTERIAL?  Si usted tiene entre 18 y 39aos, debe medirse la presin arterial cada 3a 5aos. Si usted tiene 40aos o ms, debe medirse la presin arterial [employer]. Debe medirse la presin arterial dos veces: una vez cuando est en un hospital o una clnica y la otra vez cuando est en otro sitio. Registre el promedio de [employer]. Para controlar su presin arterial cuando no est en un hospital o Paulita Cradle, puede usar lo siguiente: ? Valere Dross automtica para medir la presin arterial en una farmacia. ? Un monitor para medir la presin arterial en el hogar.  Hable con el mdico [employer] ideales de la presin arterial.  Si tiene entre 45 y 79aos, consltele al mdico si debe tomar aspirina para evitar las cardiopatas coronarias.  Hgase anlisis habituales de deteccin de la diabetes; para ello, contrlese la glucemia en ayunas. ? Si su peso es normal y tiene un bajo riesgo de padecer diabetes, realcese este anlisis cada tres aos despus de los 45aos. ? Si tiene sobrepeso y un alto riesgo de padecer diabetes, considere someterse a este anlisis antes o con mayor frecuencia.  Para los hombres que tienen entre 65 y 21aos, y son o han sido fumadores, se recomienda un nico estudio con ecografa para Engineer, manufacturing un aneurisma de aorta abdominal (AAA). QU DEBO SABER SOBRE LA PREVENCIN DE LAS INFECCIONES? HepatitisB Si tiene un riesgo ms alto de Primary school teacher hepatitis B, debe someterse a un examen de deteccin de este virus. Hable con el mdico para determinar si corre riesgo de tener una infeccin por hepatitisB. Hepatitis C Se recomienda un anlisis de [place]  para:  Todos los que nacieron entre 1945 y [phone].  Todas las personas que tengan un riesgo de haber contrado hepatitis C. Enfermedades de transmisin sexual (ETS)  Debe realizarse pruebas de Airline pilot de las ETS todos los aos, incluidas la gonorrea y la clamidia, en estos casos: ? Es sexualmente activo y es menor de [place]. ? Es mayor de 24aos, y Public affairs consultant informa que corre riesgo de tener este tipo de infecciones. ? La actividad sexual ha cambiado desde que le hicieron la ltima prueba de deteccin y tiene un riesgo mayor de Warehouse manager clamidia o Copy. Pregntele al mdico si usted tiene riesgo.  Consulte a su mdico para saber si tiene un alto riesgo de infectarse por el VIH. El mdico puede recomendarle un medicamento de venta con receta para ayudar a evitar la infeccin por el VIH. QU MS PUEDO HACER?  Realcese los estudios de rutina de la salud, dentales y de Wellsite geologist.  Mantngase al da con las vacunas (  inmunizaciones).  No consuma ningn producto que contenga tabaco, lo que incluye cigarrillos, tabaco de Theatre manager y Administrator, Civil Service. Si necesita ayuda para dejar de fumar, consulte al mdico.  Limite el consumo de alcohol a no ms de por da. [employer] a 12 onzas de cerveza, 5onzas de vino o 1onzas de bebidas alcohlicas de alta graduacin.  No consuma drogas.  No comparta agujas.  Solicite ayuda a su mdico si necesita apoyo o informacin para abandonar las drogas.  Informe a su mdico si a menudo se siente deprimido.  Notifique a su mdico si alguna vez ha sido vctima de abuso o si no se siente seguro en su hogar. Esta informacin no tiene Theme park manager el consejo del mdico. Asegrese de hacerle al mdico cualquier pregunta que tenga. Document Released: 08/19/2007 Document Revised: 03/13/2014 Document Reviewed: 11/24/2014 Elsevier Interactive Patient Education  [employer].

## 2017-07-25 NOTE — Progress Notes (Signed)
Subjective:  By signing my name below, I, Stann Ore, attest that this documentation has been prepared under the direction and in the presence of Norberto Sorenson, MD. Electronically Signed: Stann Ore, Scribe. 07/25/2017 , 11:28 AM .  Patient was seen in Room 3 .   Patient ID: Robert Jordan, male    DOB: 1975/05/08, 42 y.o.   MRN: [id number] Chief Complaint  Patient presents with  . Annual Exam    Pt wants to be sure that livers and kidneys are checked.  . Rash    both armpits, pt states rash is red and states when it is really hot rash itches.   HPI  He is fasting today.   Abdominal discomfort He was seen in the ER for abdominal pain about a month ago, which has improved. He still has occasional abdominal pains, over the right side; usually occurs while he's eating. He notes this pain has been ongoing for a few weeks now. He denies increased gas, or urinary symptoms.   Primary Preventative Screenings: Prostate Cancer: STI screening: agrees to testing today.  Tobacco use/AAA/Lung/EtOH/Illicit substances: he drinks alcohol socially.  Cardiac: EKG updated today.  Weight/Blood sugar/Diet/Exercise: He's been exercising a bit.  BMI Readings from Last 3 Encounters:  07/25/17 26.40 kg/m  06/10/17 26.46 kg/m  09/15/16 27.40 kg/m   No results found for: HGBA1C OTC/Vit/Supp/Herbal: he takes multivitamins. He denies taking tylenol or OTC NSAIDs.  Dentist/Optho: Immunizations:  There is no immunization history for the selected administration types on file for this patient.   Tetanus: updated today.   History reviewed. No pertinent past medical history. History reviewed. No pertinent surgical history. Current Outpatient Medications on File Prior to Visit  Medication Sig Dispense Refill  . Diphenhyd-Hydrocort-Nystatin (FIRST-DUKES MOUTHWASH) SUSP Use as directed 5 mLs in the mouth or throat every 6 (six) hours as needed. Gargle and spit out (Patient not taking: Reported on  07/25/2017) 237 mL 0   No current facility-administered medications on file prior to visit.    No Known Allergies Family History  Problem Relation Age of Onset  . Depression Mother   . Gout Father   . Diabetes Father   . Osteoporosis Maternal Grandmother    Social History   Socioeconomic History  . Marital status: Single    Spouse name: Not on file  . Number of children: Not on file  . Years of education: Not on file  . Highest education level: Not on file  Occupational History  . Not on file  Social Needs  . Financial resource strain: Not on file  . Food insecurity:    Worry: Not on file    Inability: Not on file  . Transportation needs:    Medical: Not on file    Non-medical: Not on file  Tobacco Use  . Smoking status: Never Smoker  . Smokeless tobacco: Never Used  Substance and Sexual Activity  . Alcohol use: Yes    Alcohol/week: 1.2 oz    Types: 2 Cans of beer per week    Comment: occassionally  . Drug use: No  . Sexual activity: Not on file  Lifestyle  . Physical activity:    Days per week: Not on file    Minutes per session: Not on file  . Stress: Not on file  Relationships  . Social connections:    Talks on phone: Not on file    Gets together: Not on file    Attends religious service: Not on file  Active member of club or organization: Not on file    Attends meetings of clubs or organizations: Not on file    Relationship status: Not on file  Other Topics Concern  . Not on file  Social History Narrative  . Not on file   Depression screen [facility] 2/9 07/25/2017 09/15/2016 01/28/2016  Decreased Interest 0 0 0  Down, Depressed, Hopeless 0 0 0  PHQ - 2 Score 0 0 0   Review of Systems  Constitutional: Negative for fatigue and unexpected weight change.  Eyes: Negative for visual disturbance.  Respiratory: Negative for cough, chest tightness and shortness of breath.   Cardiovascular: Negative for chest pain, palpitations and leg swelling.    Gastrointestinal: Positive for abdominal pain. Negative for blood in stool.  Skin: Positive for rash.  Neurological: Negative for dizziness, light-headedness and headaches.       Objective:   Physical Exam  Constitutional: He is oriented to person, place, and time. He appears well-developed and well-nourished. No distress.  HENT:  Head: Normocephalic and atraumatic.  Left Ear: A middle ear effusion is present.  Nose: Rhinorrhea (with erythema) present.  Eyes: Pupils are equal, round, and reactive to light. EOM are normal.  Neck: Neck supple. No thyromegaly present.  Cardiovascular: Normal rate, regular rhythm, S1 normal, S2 normal and normal heart sounds.  No murmur heard. Pulses:      Dorsalis pedis pulses are 2+ on the right side, and 2+ on the left side.  Pulmonary/Chest: Effort normal and breath sounds normal. No respiratory distress.  Abdominal: He exhibits no mass. There is no hepatosplenomegaly. There is negative Murphy's sign.  Musculoskeletal: Normal range of motion.  Lymphadenopathy:    He has no cervical adenopathy.  Neurological: He is alert and oriented to person, place, and time.  Skin: Skin is warm and dry.  Psychiatric: He has a normal mood and affect. His behavior is normal.  Nursing note and vitals reviewed.   BP 130/76 (BP Location: Left Arm, Patient Position: Sitting, Cuff Size: Normal)   Pulse 68   Temp 97.9 F (36.6 C) (Oral)   Resp 18   Ht  (1.727 m)   Wt 173 lb 9.6 oz (78.7 kg)   SpO2 98%   BMI 26.40 kg/m     Visual Acuity Screening   Right eye Left eye Both eyes  Without correction:  With correction:      EKG: NSR, no acute ischemic changes noted.  I have personally reviewed the EKG tracing and agree with the computer interpretation.      Assessment & Plan:   1. Annual physical exam   2. Screening for cardiovascular, respiratory, and genitourinary diseases   3. Screening for thyroid disorder   4. Right upper  quadrant abdominal pain   5. Routine screening for STI (sexually transmitted infection)   6. Screening for HIV (human immunodeficiency virus)   7. Need for hepatitis C screening test     Orders Placed This Encounter  Procedures  . GC/Chlamydia Probe Amp  . US Abdomen Limited RUQ    Epic order Wt 171/ no needs/ ins uhc    Standing Status:   Future    Number of Occurrences:   1    Standing Expiration Date:   09/25/2018    Order Specific Question:   Reason for Exam (SYMPTOM  OR DIAGNOSIS REQUIRED)    Answer:   RUQ post-prandial pain, possible cholelithiasis?    Order Specific Question:  Preferred imaging location?    Answer:   [facility]  . Tdap vaccine greater than or equal to 7yo IM  . Comprehensive metabolic panel    Order Specific Question:   Has the patient fasted?    Answer:   Yes  . Lipid panel    Order Specific Question:   Has the patient fasted?    Answer:   Yes  . TSH  . CBC with Differential/Platelet  . HIV antibody  . HCV Ab w/Rflx to Verification  . RPR  . CBC with Differential/Platelet  . RPR  . HIV antibody  . HCV Ab w/Rflx to Verification  . Interpretation:  . POCT urinalysis dipstick  . EKG 12-Lead    Meds ordered this encounter  Medications  . triamcinolone (NASACORT) 55 MCG/ACT AERO nasal inhaler    Sig: Place 1 spray into the nose 2 (two) times daily. X 2 weeks then as needed    Dispense:  1 Inhaler    Refill:  1    I personally performed the services described in this documentation, which was scribed in my presence. The recorded information has been reviewed and considered, and addended by me as needed.   Norberto Sorenson, M.D.  Primary Care at [facility] [address] [place], [place] [address] [phone] phone [phone] fax  09/13/17 11:22 AM

## 2017-07-26 LAB — LIPID PANEL
CHOLESTEROL TOTAL: 218 mg/dL — AB (ref 100–199)
Chol/HDL Ratio: 3.7 ratio (ref 0.0–5.0)
HDL: 59 mg/dL (ref >39)
LDL Calculated: 145 mg/dL — ABNORMAL HIGH (ref 0–99)
Triglycerides: 68 mg/dL (ref 0–149)
VLDL Cholesterol Cal: 14 mg/dL (ref 5–40)

## 2017-07-26 LAB — COMPREHENSIVE METABOLIC PANEL
ALBUMIN: 4.9 g/dL (ref 3.5–5.5)
ALK PHOS: 73 IU/L (ref 39–117)
ALT: 41 IU/L (ref 0–44)
AST: 23 IU/L (ref 0–40)
Albumin/Globulin Ratio: 2 (ref 1.2–2.2)
BILIRUBIN TOTAL: 0.5 mg/dL (ref 0.0–1.2)
BUN / CREAT RATIO: 15 (ref 9–20)
BUN: 16 mg/dL (ref 6–24)
CHLORIDE: 101 mmol/L (ref 96–106)
CO2: 25 mmol/L (ref 20–29)
Calcium: 9.5 mg/dL (ref 8.7–10.2)
Creatinine, Ser: 1.07 mg/dL (ref 0.76–1.27)
GFR calc Af Amer: 98 mL/min/{1.73_m2} (ref >59)
GFR calc non Af Amer: 85 mL/min/{1.73_m2} (ref >59)
GLOBULIN, TOTAL: 2.4 g/dL (ref 1.5–4.5)
GLUCOSE: 84 mg/dL (ref 65–99)
Potassium: 4.2 mmol/L (ref 3.5–5.2)
SODIUM: 141 mmol/L (ref 134–144)
Total Protein: 7.3 g/dL (ref 6.0–8.5)

## 2017-07-26 LAB — CBC WITH DIFFERENTIAL/PLATELET
Basophils Absolute: 0 10*3/uL (ref 0.0–0.2)
Basos: 1 %
EOS (ABSOLUTE): 0.2 10*3/uL (ref 0.0–0.4)
EOS: 4 %
HEMATOCRIT: 44.5 % (ref 37.5–51.0)
HEMOGLOBIN: 15.6 g/dL (ref 13.0–17.7)
IMMATURE GRANULOCYTES: 0 %
Immature Grans (Abs): 0 10*3/uL (ref 0.0–0.1)
LYMPHS: 24 %
Lymphocytes Absolute: 1 10*3/uL (ref 0.7–3.1)
MCH: 29.1 pg (ref 26.6–33.0)
MCHC: 35.1 g/dL (ref 31.5–35.7)
MCV: 83 fL (ref 79–97)
MONOCYTES: 11 %
Monocytes Absolute: 0.5 10*3/uL (ref 0.1–0.9)
NEUTROS PCT: 60 %
Neutrophils Absolute: 2.5 10*3/uL (ref 1.4–7.0)
Platelets: 218 10*3/uL (ref 150–450)
RBC: 5.36 x10E6/uL (ref 4.14–5.80)
RDW: 14.5 % (ref 12.3–15.4)
WBC: 4.1 10*3/uL (ref 3.4–10.8)

## 2017-07-26 LAB — RPR: RPR Ser Ql: NONREACTIVE

## 2017-07-26 LAB — HIV ANTIBODY (ROUTINE TESTING W REFLEX): HIV SCREEN 4TH GENERATION: NONREACTIVE

## 2017-07-26 LAB — TSH: TSH: 2.03 u[IU]/mL (ref 0.450–4.500)

## 2017-07-26 LAB — GC/CHLAMYDIA PROBE AMP
Chlamydia trachomatis, NAA: NEGATIVE
Neisseria gonorrhoeae by PCR: NEGATIVE

## 2017-07-26 LAB — HCV INTERPRETATION

## 2017-07-26 LAB — HCV AB W/RFLX TO VERIFICATION

## 2017-08-21 ENCOUNTER — Telehealth: Payer: Self-pay | Admitting: Family Medicine

## 2017-08-21 NOTE — Telephone Encounter (Signed)
Copied from CRM [id number]. Topic: Quick Communication - See Telephone Encounter >> Aug 21, 2017  2:29 PM Lorrine KinMcGee, Jamilya Sarrazin B, NT wrote: CRM for notification. See Telephone encounter for: 08/21/17. Patient's niece, Darien Ramusna, calling and states that the patient was wanting to know if his results are ready yet? States that they have not heard anything yet. Please advise. Cb#: [phone]

## 2017-08-22 NOTE — Telephone Encounter (Signed)
Release Labs  Thank you

## 2017-08-27 ENCOUNTER — Ambulatory Visit
Admission: RE | Admit: 2017-08-27 | Discharge: 2017-08-27 | Disposition: A | Discharge status: Home / Self Care | Payer: No Typology Code available for payment source | Source: Ambulatory Visit | Attending: Family Medicine | Admitting: Family Medicine

## 2017-08-27 ENCOUNTER — Telehealth: Payer: Self-pay | Admitting: Family Medicine

## 2017-08-27 DIAGNOSIS — R1011 Right upper quadrant pain: Secondary | ICD-10-CM

## 2017-08-27 NOTE — Telephone Encounter (Signed)
Copied from CRM [id number]. Topic: Quick Communication - See Telephone Encounter >> Aug 21, 2017  2:29 PM Lorrine KinMcGee, Demi B, NT wrote: CRM for notification. See Telephone encounter for: 08/21/17. Patient's niece, Darien Ramusna, calling and states that the patient was wanting to know if his results are ready yet? States that they have not heard anything yet. Please advise. Cb#: [phone] >> Aug 27, 2017  4:08 PM Terisa Starraylor, Brittany L wrote: Please call patient back with results [phone] ( ana )

## 2017-08-27 NOTE — Telephone Encounter (Signed)
Copied from CRM #[id number]. Topic: Quick Communication - See Telephone Encounter °>> Aug 21, 2017  2:29 PM McGee, Demi B, NT wrote: °CRM for notification. See Telephone encounter for: 08/21/17. °Patient's niece, Ana, calling and states that the patient was wanting to know if his results are ready yet? States that they have not heard anything yet. Please advise. Cb#: [phone] °>> Aug 27, 2017  4:08 PM Taylor, Brittany L wrote: °Please call patient back with results [phone] ( ana ) °

## 2017-08-28 NOTE — Telephone Encounter (Signed)
CRM completed to release results of labs and imaging.

## 2017-08-28 NOTE — Telephone Encounter (Signed)
Copied from CRM [id number]. Topic: General - Other >> Aug 28, 2017  4:36 PM Marylen PontoMcneil, Ja-Kwan wrote: Reason for CRM: Pt niece Darien Ramusna states pt still has not received a call regarding his lab results. Ana asked that the pt be contacted to go over the lab results. Ana states pt would need someone who speaks [place]. Cb# [phone]

## 2017-08-29 NOTE — Telephone Encounter (Signed)
Pt. With interpreter, called to receive lab results and imaging results.Given. Verbalizes understanding.

## 2017-09-13 ENCOUNTER — Encounter: Payer: Self-pay | Admitting: Radiology

## 2017-09-13 NOTE — Telephone Encounter (Signed)
Sent to appropriate pools

## 2018-09-05 ENCOUNTER — Other Ambulatory Visit: Payer: Self-pay

## 2018-09-05 ENCOUNTER — Ambulatory Visit (HOSPITAL_COMMUNITY)
Admission: EM | Admit: 2018-09-05 | Discharge: 2018-09-05 | Disposition: A | Discharge status: Home / Self Care | Payer: BLUE CROSS/BLUE SHIELD | Attending: Internal Medicine | Admitting: Internal Medicine

## 2018-09-05 ENCOUNTER — Encounter (HOSPITAL_COMMUNITY): Payer: Self-pay | Admitting: Emergency Medicine

## 2018-09-05 DIAGNOSIS — L255 Unspecified contact dermatitis due to plants, except food: Secondary | ICD-10-CM | POA: Diagnosis not present

## 2018-09-05 MED ORDER — HYDROXYZINE HCL 25 MG PO TABS: 25.0000 mg | ORAL_TABLET | Freq: Three times a day (TID) | ORAL | 0 refills | Status: DC | PRN

## 2018-09-05 NOTE — ED Triage Notes (Addendum)
Noticed itchy rash on Monday after being in garden over the week end.  Rash appearing on hands, arm, neck, chest and left leg

## 2018-09-05 NOTE — ED Provider Notes (Signed)
[facility]    CSN: [id number] Arrival date & time: 09/05/18  1032      History   Chief Complaint Chief Complaint  Patient presents with  . Poison Oak    HPI Charlott RakesRamon Arreola Allen NorrisLira is a 43 y.o. male no past medical history comes to urgent care with complaint of itching over both upper extremities and chest.  Symptoms started Monday.  Patient was working in his garden over the weekend.  He believes that he is going to contact with poison ivy.  He started noticing a rash over his chest and upper extremities.  Rash is very pruritic.  He has not tried any over-the-counter medications.  Scratching the rash aggravates the itching.  No fever or chills no discharge from the rash.  HPI  History reviewed. No pertinent past medical history.  There are no active problems to display for this patient.   History reviewed. No pertinent surgical history.     Home Medications    Prior to Admission medications   Medication Sig Start Date End Date Taking? Authorizing Provider  Diphenhyd-Hydrocort-Nystatin (FIRST-DUKES MOUTHWASH) SUSP Use as directed 5 mLs in the mouth or throat every 6 (six) hours as needed. Gargle and spit out Patient not taking: Reported on 07/25/2017 09/15/16   McVey, Madelaine BhatElizabeth Whitney, PA-C  triamcinolone (NASACORT) 55 MCG/ACT AERO nasal inhaler Place 1 spray into the nose 2 (two) times daily. X 2 weeks then as needed 07/25/17   Sherren MochaShaw, Eva N, MD    Family History Family History  Problem Relation Age of Onset  . Depression Mother   . Gout Father   . Diabetes Father   . Osteoporosis Maternal Grandmother     Social History Social History   Tobacco Use  . Smoking status: Never Smoker  . Smokeless tobacco: Never Used  Substance Use Topics  . Alcohol use: Yes    Alcohol/week: 2.0 standard drinks    Types: 2 Cans of beer per week    Comment: occassionally  . Drug use: No     Allergies   Patient has no known allergies.   Review of Systems Review of  Systems  Constitutional: Negative.   HENT: Negative.   Respiratory: Negative.   Cardiovascular: Negative.   Gastrointestinal: Negative.   Genitourinary: Negative.   Musculoskeletal: Negative.   Skin: Positive for rash. Negative for color change, pallor and wound.     Physical Exam Triage Vital Signs ED Triage Vitals [09/05/18 1054]  Enc Vitals Group     BP      Pulse      Resp      Temp      Temp src      SpO2      Weight      Height      Head Circumference      Peak Flow      Pain Score 0     Pain Loc      Pain Edu?      Excl. in GC?    No data found.  Updated Vital Signs There were no vitals taken for this visit.  Visual Acuity Right Eye Distance:   Left Eye Distance:   Bilateral Distance:    Right Eye Near:   Left Eye Near:    Bilateral Near:     Physical Exam Constitutional:      General: He is not in acute distress.    Appearance: Normal appearance. He is not ill-appearing or toxic-appearing.  Cardiovascular:  Pulses: Normal pulses.     Heart sounds: Normal heart sounds.  Pulmonary:     Effort: Pulmonary effort is normal.     Breath sounds: Normal breath sounds.  Abdominal:     General: Bowel sounds are normal. There is no distension.     Palpations: Abdomen is soft.     Tenderness: There is no abdominal tenderness. There is no guarding.  Musculoskeletal: Normal range of motion.  Skin:    Capillary Refill: Capillary refill takes less than 2 seconds.     Comments: Papular rash with areas of excoriation over anterior chest wall.  Sporadic papular lesions on the upper extremities with some overlying excoriations.  No surrounding erythema.  Neurological:     General: No focal deficit present.     Mental Status: He is alert.      UC Treatments / Results  Labs (all labs ordered are listed, but only abnormal results are displayed) Labs Reviewed - No data to display  EKG   Radiology No results found.  Procedures Procedures (including  critical care time)  Medications Ordered in UC Medications - No data to display  Initial Impression / Assessment and Plan / UC Course  I have reviewed the triage vital signs and the nursing notes.  Pertinent labs & imaging results that were available during my care of the patient were reviewed by me and considered in my medical decision making (see chart for details).     1. Rhus dermatitis: Vistaril as needed for pruritus If symptoms worsens patient may need to apply some topical steroids for a couple of days to help with the inflammatory process. Patient is advised to return to urgent care if symptoms worsen. Final Clinical Impressions(s) / UC Diagnoses   Final diagnoses:  None   Discharge Instructions   None    ED Prescriptions    None     Controlled Substance Prescriptions [place] Controlled Substance Registry consulted? No   Chase Picket, MD 09/05/18 1116

## 2018-10-31 ENCOUNTER — Telehealth: Payer: Self-pay | Admitting: Emergency Medicine

## 2018-10-31 NOTE — Telephone Encounter (Signed)
Pt may have a missed called / please verify pts insurance verified as [facility] / may need to cancel pts appt . We are OON    FR

## 2018-11-12 ENCOUNTER — Encounter: Payer: BLUE CROSS/BLUE SHIELD | Admitting: Emergency Medicine

## 2018-11-13 ENCOUNTER — Encounter: Payer: Self-pay | Admitting: Emergency Medicine

## 2019-05-16 ENCOUNTER — Ambulatory Visit: Payer: Self-pay | Attending: Internal Medicine

## 2019-05-16 DIAGNOSIS — Z23 Encounter for immunization: Secondary | ICD-10-CM

## 2019-05-16 NOTE — Progress Notes (Signed)
   Covid-19 Vaccination Clinic  Name:  Robert Jordan    MRN: [id number] DOB: 1975-09-06  05/16/2019  [name] was observed post Covid-19 immunization for 15 minutes without incident. He was provided with Vaccine Information Sheet and instruction to access the V-Safe system.   [name] was instructed to call 911 with any severe reactions post vaccine: Marland Kitchen Difficulty breathing  . Swelling of face and throat  . A fast heartbeat  . A bad rash all over body  . Dizziness and weakness   Immunizations Administered    Name Date Dose VIS Date Route   Pfizer COVID-19 Vaccine 05/16/2019  8:22 AM 0.3 mL 02/14/2019 Intramuscular   Manufacturer: [employer], [employer]   Lot: [id number]   NDC: [address]-1

## 2019-06-09 ENCOUNTER — Ambulatory Visit: Payer: Self-pay | Attending: Internal Medicine

## 2019-06-09 DIAGNOSIS — Z23 Encounter for immunization: Secondary | ICD-10-CM

## 2019-06-09 NOTE — Progress Notes (Signed)
   Covid-19 Vaccination Clinic  Name:  Robert Jordan    MRN: [id number] DOB: 01-06-1976  06/09/2019  Mr. Robert Jordan was observed post Covid-19 immunization for 15 minutes without incident. He was provided with Vaccine Information Sheet and instruction to access the V-Safe system.   Mr. Robert Jordan was instructed to call 911 with any severe reactions post vaccine: Marland Kitchen Difficulty breathing  . Swelling of face and throat  . A fast heartbeat  . A bad rash all over body  . Dizziness and weakness   Immunizations Administered    Name Date Dose VIS Date Route   Pfizer COVID-19 Vaccine 06/09/2019 11:10 AM 0.3 mL 02/14/2019 Intramuscular   Manufacturer: [employer], [employer]   Lot: [id number]   NDC: [address]-9

## 2019-07-12 IMAGING — US US ABDOMEN LIMITED
1 series · 14 of 25 positions shown · non-contrast
Comparison: None.

CLINICAL DATA: Right upper quadrant pain.

EXAM:
ULTRASOUND ABDOMEN LIMITED RIGHT UPPER QUADRANT

[Series 1: us abdomen limited · 0.17mm/px · 14 of 43 slices shown]
[im 1/43]
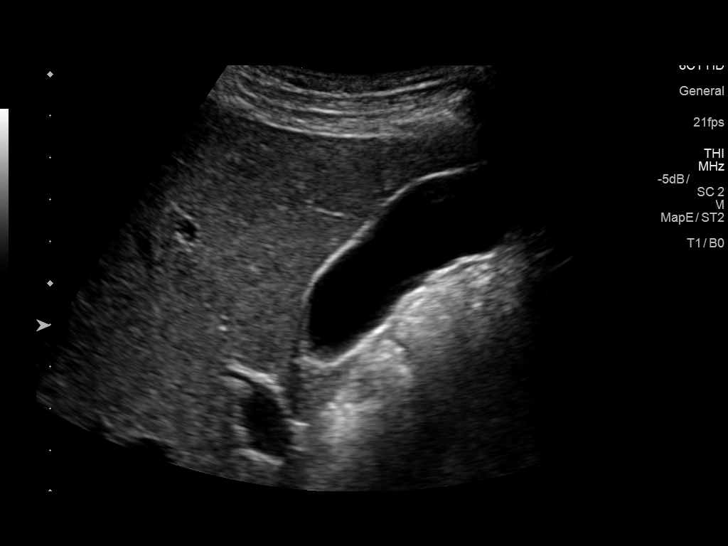
[im 4/43]
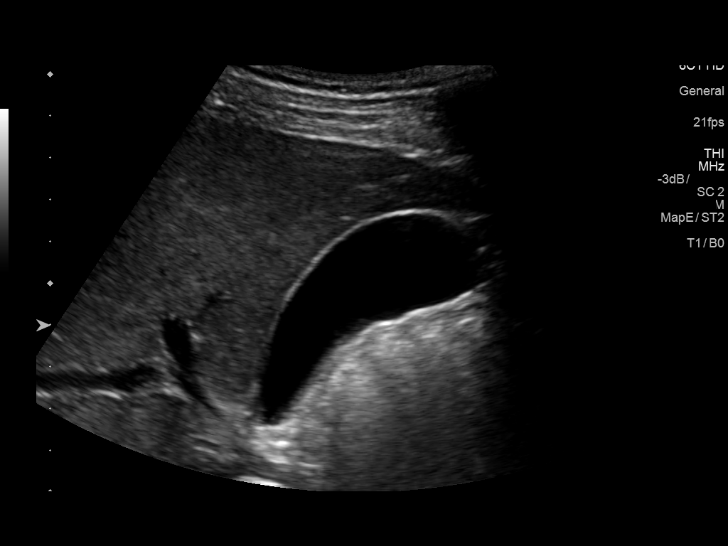
[im 8/43]
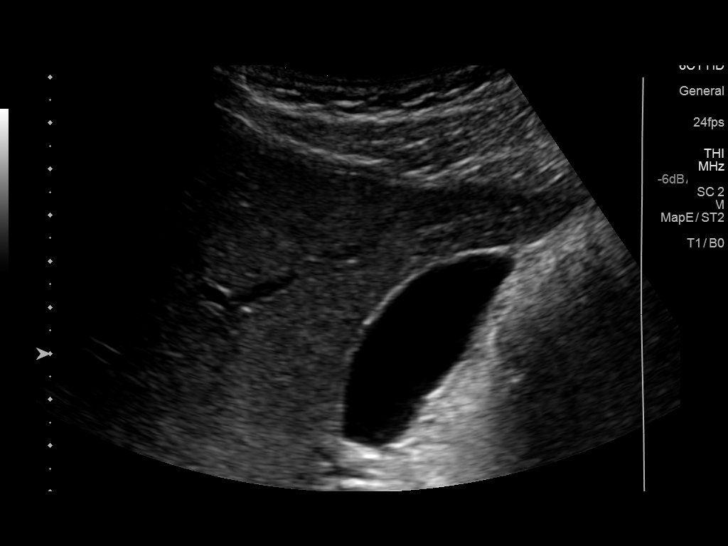
[im 11/43]
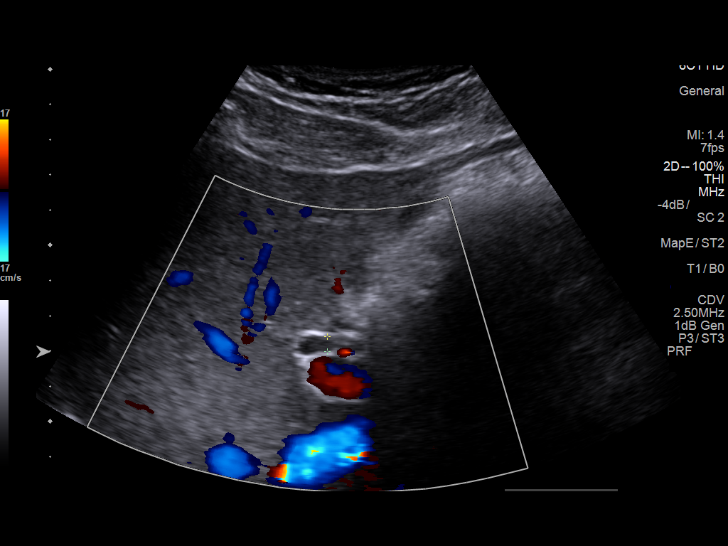
[im 15/43]
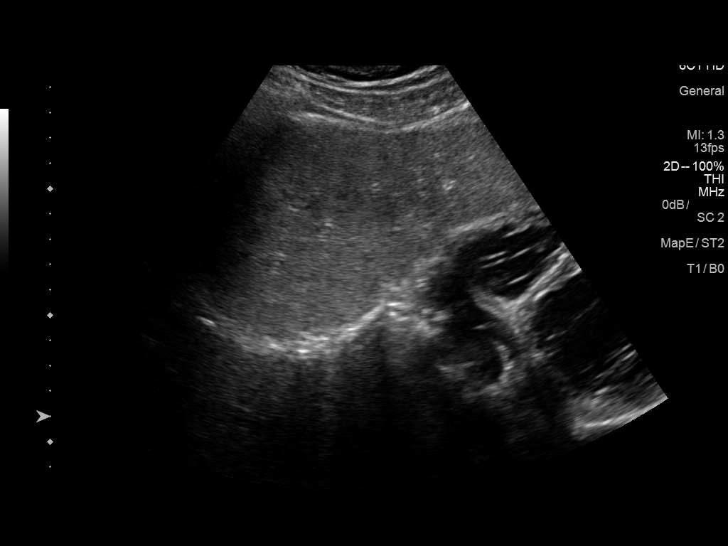
[im 16/43]
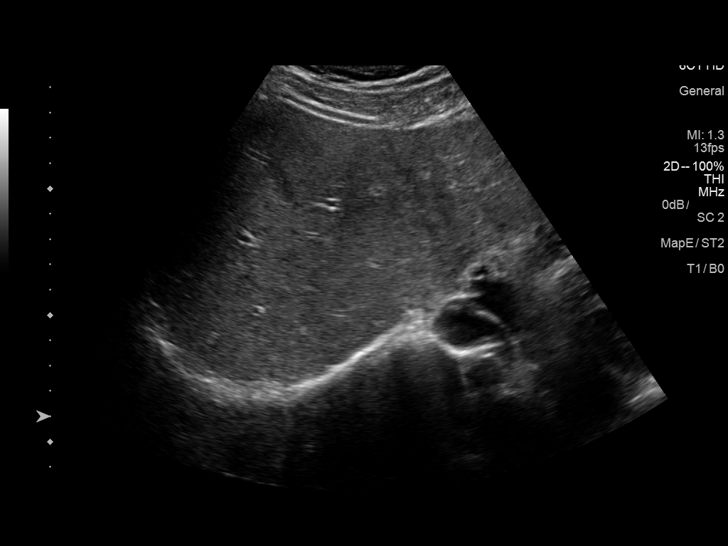
[im 20/43]
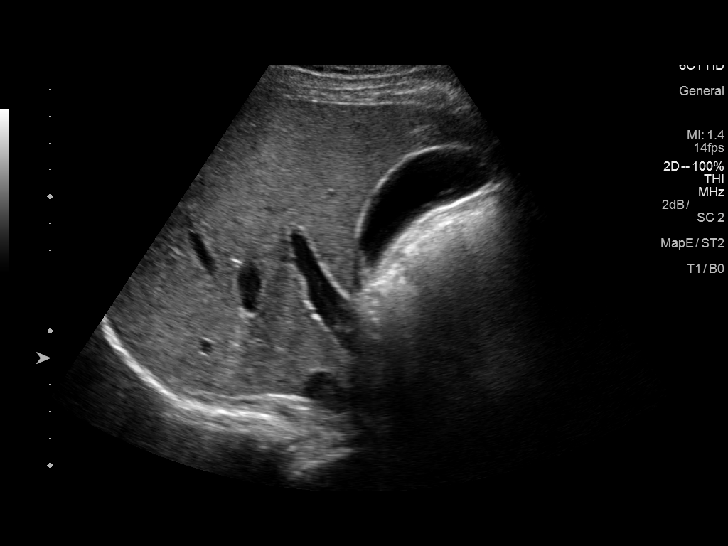
[im 23/43]
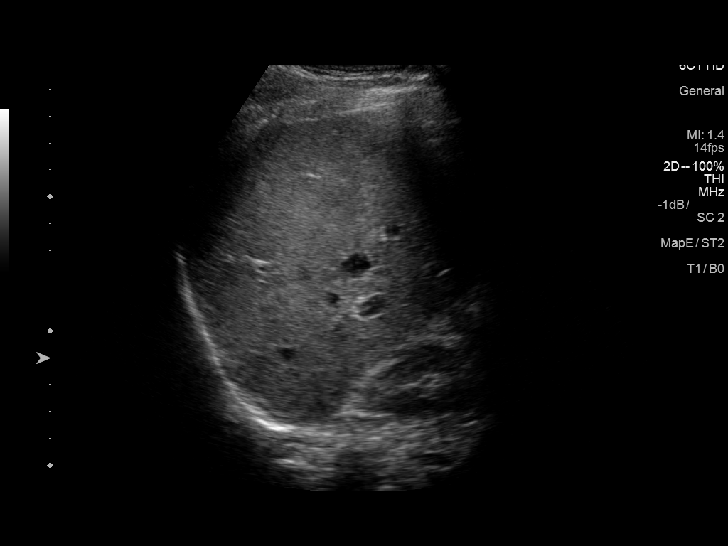
[im 27/43]
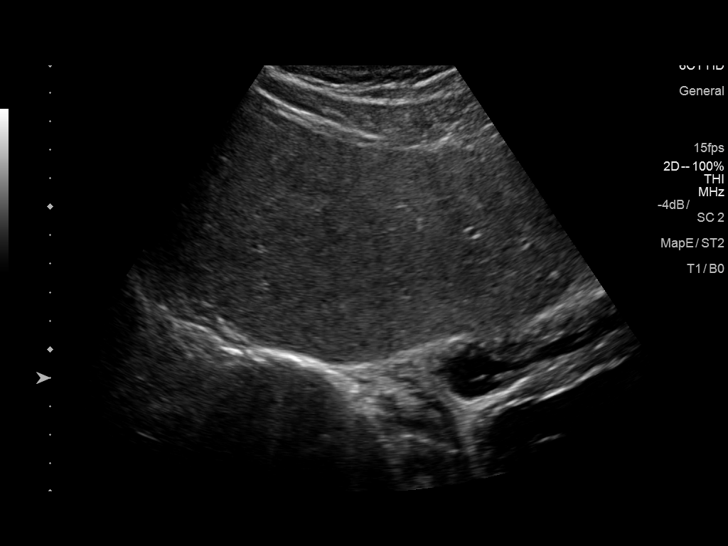
[im 29/43]
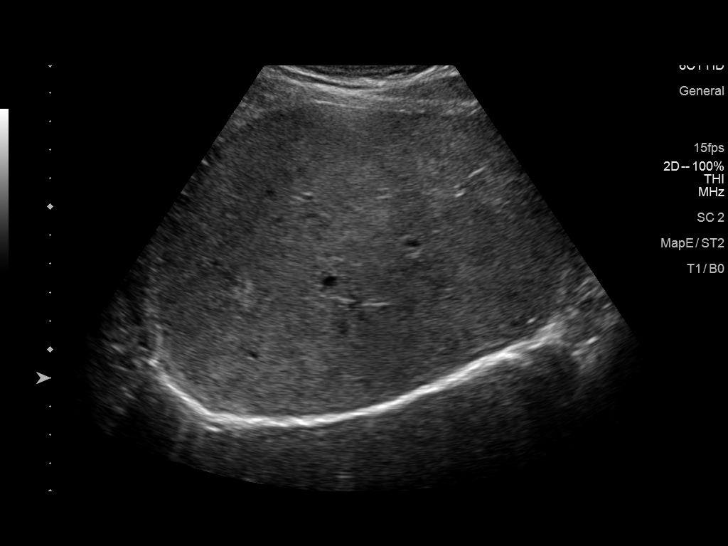
[im 32/43]
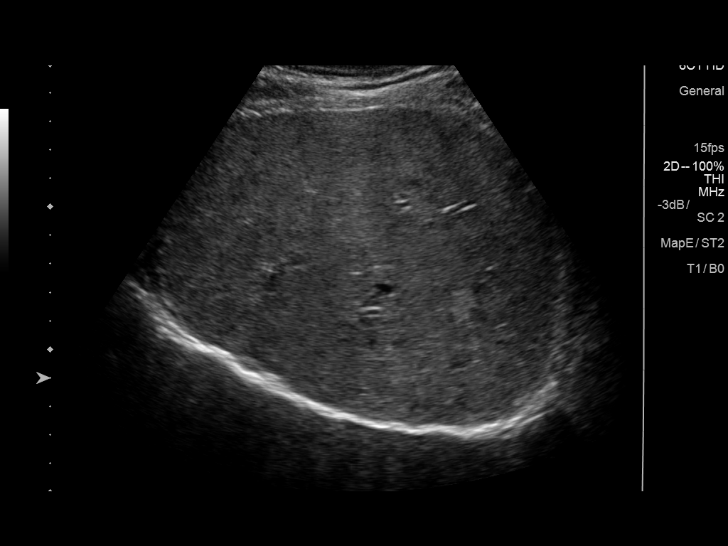
[im 36/43]
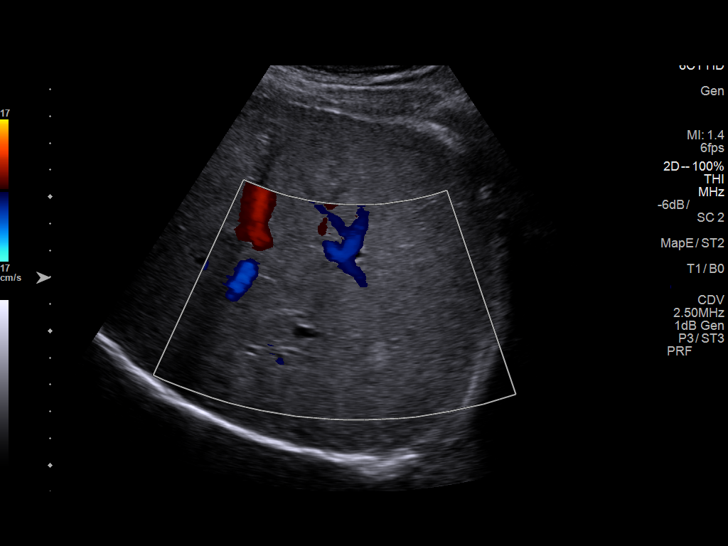
[im 39/43]
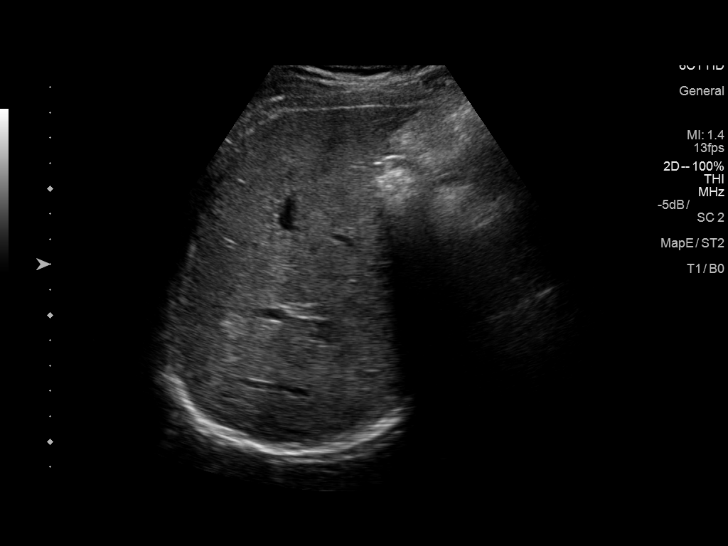
[im 43/43]
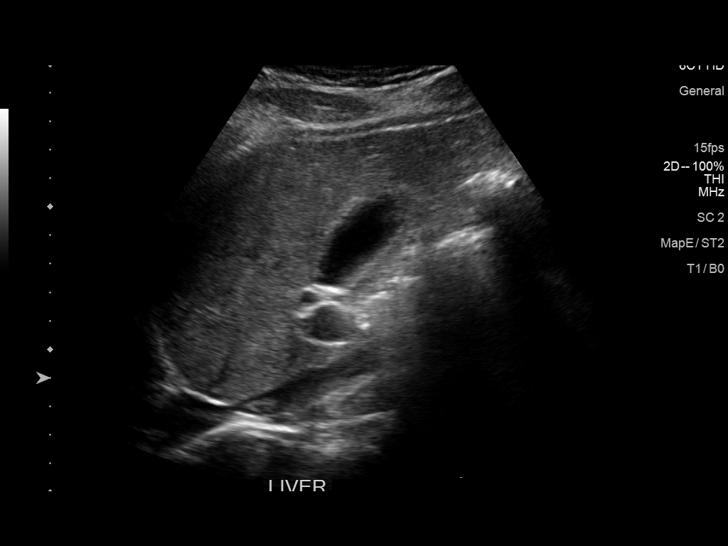

[14 of 25 positions shown; findings below may reference images not displayed]

FINDINGS: Gallbladder:

No gallstones or wall thickening visualized. No sonographic Murphy
sign noted by sonographer.

Common bile duct:

Diameter: 4.2 mm

Liver:

There is a mildly hyperechoic lesion in the right hepatic lobe
measuring 10 x 10 x 11 mm. No increased through transmission. No
other masses. Portal vein is patent on color Doppler imaging with
normal direction of blood flow towards the liver.
IMPRESSION: 1. There is a 10 mm mildly hyperechoic lesion in the right hepatic
lobe. A hemangioma is the most likely etiology but the lesion is
nonspecific on today's study. Comparison to outside films could be
performed if available. Otherwise, an MRI could confirm.
2. No acute abnormalities.

## 2019-10-21 ENCOUNTER — Other Ambulatory Visit: Payer: Self-pay

## 2019-10-21 ENCOUNTER — Ambulatory Visit (INDEPENDENT_AMBULATORY_CARE_PROVIDER_SITE_OTHER): Payer: Self-pay

## 2019-10-21 VITALS — BP 132/80 | HR 62 | Temp 98.1°F | Ht 68.0 in | Wt 173.0 lb

## 2019-10-21 DIAGNOSIS — Z719 Counseling, unspecified: Secondary | ICD-10-CM

## 2019-10-22 ENCOUNTER — Encounter (HOSPITAL_COMMUNITY): Payer: Self-pay | Admitting: Emergency Medicine

## 2019-11-18 NOTE — Patient Instructions (Signed)
Pt is reminded that he may have red/dry skin after & may experience some peeling/irritation He will protect skin with sunscreen/cover & use Dermal Repair wash/moisturize formula He will call for any concerns F/u in 6 weeks

## 2019-11-18 NOTE — Progress Notes (Signed)
Preoperative Dx: facial aging  Postoperative Dx:  same  Procedure: laser to face  Anesthesia: EMLA cream 30 mins prior to procedure  Description of Procedure:  Risks and complications were explained to the patient. Consent was confirmed and signed. Time out was called and all information was confirmed to be correct. The area was prepped with alcohol and wiped dry.  The FRAX laser was set at the following:  skin -4 / sun- med -  24.6mJ/ 20% coverage with 4 passes  The entire face was lasered The patient tolerated the procedure well and there were no complications.   Aloe & post laser balm  applied after procedure he is reminded to protect skin with  sunscreen & moisturizer & avoid retina/retinol products & no abrasive scrubs for approx. 10 days  He is reminded that he may experience redness/peeling & irritation Patient to f/u in 6 weeks- he plans to have a 2nd FRAX laser procedure at  next appointment   He will call for any concerns or questions [facility]

## 2019-12-23 ENCOUNTER — Other Ambulatory Visit: Payer: Self-pay

## 2019-12-23 ENCOUNTER — Ambulatory Visit (INDEPENDENT_AMBULATORY_CARE_PROVIDER_SITE_OTHER): Payer: Self-pay

## 2019-12-23 VITALS — BP 124/77 | HR 67 | Temp 98.1°F | Ht 69.0 in | Wt 173.0 lb

## 2019-12-23 DIAGNOSIS — Z719 Counseling, unspecified: Secondary | ICD-10-CM

## 2019-12-26 NOTE — Progress Notes (Signed)
Preoperative Dx: facial aging  Postoperative Dx:  same  Procedure: laser to face/2nd FRAX  Anesthesia:  EMLA applied 30 mins prior to procedure  Description of Procedure:  Risks and complications were explained to the patient. Consent was confirmed and signed. Time out was called and all information was confirmed to be correct. The area  was prepped with alcohol and wiped dry.  The FRAX laser was set at the following:  skin -4/ suntan med-  24/20 with 4 passes The entire face was lasered The patient tolerated the procedure well and there were no complications.   Aloe & post laser balm  applied after procedure Pt is reminded to protect skin with sunscreen & moisturizer & avoid retina products & no abrasive scrubs for approx. 10 days  He is reminded that he may experience redness/peeling & irritation Patient to f/u in 6 weeks- he plans to have a 3rd FRAX laser procedure at next appointment

## 2019-12-26 NOTE — Patient Instructions (Signed)
Pt know that skin may be irritated/red & dry- he may experience mild peeling. He will use sunscreen & moisturizer as needed F/U 6 weeks for 3rd FRAX if he needs it He will call for any concerns

## 2020-02-02 DIAGNOSIS — Z719 Counseling, unspecified: Secondary | ICD-10-CM

## 2020-02-03 ENCOUNTER — Other Ambulatory Visit: Payer: Self-pay

## 2020-02-03 ENCOUNTER — Ambulatory Visit (INDEPENDENT_AMBULATORY_CARE_PROVIDER_SITE_OTHER): Payer: Self-pay

## 2020-02-03 VITALS — BP 135/78 | HR 68 | Temp 98.4°F | Wt 174.0 lb

## 2020-02-03 DIAGNOSIS — Z719 Counseling, unspecified: Secondary | ICD-10-CM

## 2020-04-12 NOTE — Progress Notes (Signed)
Preoperative Dx: facial aging  Postoperative Dx:  same  Procedure: laser to face  Anesthesia: EMLA -pt applied 30 mins prior to procedure  Description of Procedure:  Risks and complications were explained to the patient. Consent was confirmed and signed. Time out was called and all information was confirmed to be correct. The area was prepped with alcohol and wiped dry. The FRAX laser was set at the following:  skin -4/ suntan - med =29/25 & 4 passes  The entire face was lasered The patient tolerated the procedure well and there were no complications.  Aloe gel & laser balm applied  He is reminded that he may experience redness/peeling & irritation.

## 2020-04-12 NOTE — Patient Instructions (Signed)
Pt is aware that he may have dryness/redness & slight peeling. Use sunscreen/moisturizer & avoid retinol products for approx 2 weeks call for any concerns. F/U in 6 weeks

## 2020-05-10 ENCOUNTER — Other Ambulatory Visit: Payer: Self-pay

## 2021-04-06 ENCOUNTER — Other Ambulatory Visit: Payer: Self-pay

## 2021-04-06 ENCOUNTER — Encounter: Payer: Self-pay | Admitting: Plastic Surgery

## 2021-04-06 ENCOUNTER — Ambulatory Visit (INDEPENDENT_AMBULATORY_CARE_PROVIDER_SITE_OTHER): Payer: Self-pay | Admitting: Plastic Surgery

## 2021-04-06 DIAGNOSIS — Z719 Counseling, unspecified: Secondary | ICD-10-CM

## 2021-04-06 NOTE — Progress Notes (Signed)
Patient presents to discuss Botox.  He is interested in treatment of the static and dynamic lines in his forehead, glabella and crows feet.  He has not had Botox before.  His exam is consistent with mostly dynamic but some static not lines in the forehead, glabella and crows feet.  We reviewed the risks and benefits of Botox treatment and he is interested in moving forward.  Face was prepped with an alcohol pad and 36 units of Botox were distributed throughout the forehead, glabella and crows feet.  2 injection points for the crows feet and 2 rows for the forehead.  He tolerated this fine.  We will plan to see him at his next visit or sooner for any touchups.  All of his questions were answered.

## 2021-07-06 ENCOUNTER — Ambulatory Visit (INDEPENDENT_AMBULATORY_CARE_PROVIDER_SITE_OTHER): Payer: Self-pay | Admitting: Plastic Surgery

## 2021-07-06 DIAGNOSIS — Z411 Encounter for cosmetic surgery: Secondary | ICD-10-CM

## 2021-07-06 NOTE — Progress Notes (Signed)
Patient presents to discuss Botox.  He is interested in treatment of the static and dynamic lines in his forehead, glabella and crows feet.  He has not had Botox before.  We did 36 units last time and he like the results.  His exam is consistent with mostly dynamic but some static not lines in the forehead, glabella and crows feet.  We reviewed the risks and benefits of Botox treatment and he is interested in moving forward.  Face was prepped with an alcohol pad and 36 units of Botox were distributed throughout the forehead, glabella and crows feet.  2 injection points for the crows feet and 2 rows for the forehead.  He tolerated this fine.  We will plan to see him at his next visit or sooner for any touchups.  All of his questions were answered. ?

## 2021-10-31 ENCOUNTER — Encounter: Payer: Self-pay | Admitting: Plastic Surgery

## 2021-10-31 ENCOUNTER — Ambulatory Visit (INDEPENDENT_AMBULATORY_CARE_PROVIDER_SITE_OTHER): Payer: Self-pay | Admitting: Plastic Surgery

## 2021-10-31 DIAGNOSIS — Z411 Encounter for cosmetic surgery: Secondary | ICD-10-CM

## 2021-10-31 NOTE — Progress Notes (Signed)
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 I discussed with the patient this proposed procedure of botulinum toxin injections, which is customized depending on the particular needs of the patient. It is performed on facial rhytids as a temporary correction. The alternatives were discussed with the patient. The risks were addressed including bleeding, scarring, infection, damage to deeper structures, asymmetry, and chronic pain, which may occur infrequently after a procedure. The individual's choice to undergo a surgical procedure is based on the comparison of risks to potential benefits. Other risks include unsatisfactory results, brow ptosis, eyelid ptosis, allergic reaction, temporary paralysis, which should go away with time, bruising, blurring disturbances and delayed healing. Botulinum toxin injections do not arrest the aging process or produce permanent tightening of the eyelid.  Operative intervention maybe necessary to maintain the results of a blepharoplasty or botulinum toxin. The patient understands and wishes to proceed.  Procedure: The area was prepped with alcohol and dried with a clean gauze. Using a clean technique, the botulinum toxin was diluted with 2.5 cc of preservative-free normal saline which was slowly injected with an 18 gauge needle in a tuberculin syringes.  A 32 gauge needles were then used to inject the botulinum toxin. This mixture allow for an aliquot of 4 units per 0.1 cc in each injection site.    Subsequently the mixture was injected into the following regions: 12 U of botox was injected into the forehead. 12 U of botox was injected into the glabella. 12 U of botox was injected into the crows feet.  Botox LOT:  [id number]  EXP:  11/2023

## 2021-11-28 ENCOUNTER — Encounter: Payer: BLUE CROSS/BLUE SHIELD | Admitting: Plastic Surgery

## 2022-04-26 NOTE — Progress Notes (Signed)
Botulinum Toxin Procedure Note   Procedure: Cosmetic botulinum toxin   Pre-operative Diagnosis: Dynamic rhytides   Post-operative Diagnosis: Same   Complications:  None   Brief history: The patient desires botulinum toxin injection.  They are aware of the risks including bleeding, damage to deeper structures, asymmetry, brow ptosis, eyelid ptosis, bruising. The patient understands and wishes to proceed.  He was last seen here in clinic by Dr. Erin Hearing and treated with a total of 36 units Botox.  This was spread out evenly between forehead, glabellar region, and crows feet.  Today, patient states that he was pleased with the amount of Botox he received at the previous encounter.  He is again hoping to have all 3 regions treated.  On exam, he has static rhytids noted to frontallis as well as slight static rhytids in the glabellar region.   Procedure: The area was prepped with alcohol and dried with a clean gauze.  Using a clean technique the botulinum toxin was diluted with 2.5 mL of bacteriostatic saline per 100 unit vial which resulted in 4 units per 0.1 mL.   Subsequently the mixture was injected in the glabellar, forehead area with preservation of the temporal branch to the lateral eyebrow. A total of 36 units of botulinum toxin was used. The forehead, glabella and crows feet were prepped and injected in a standard injection pattern using 2 injection points for the crows feet, 5-point V-shape glabellar/procerus injection, and 2 rows for the forehead.  They tolerated this fine.      No complications were noted. Light pressure was held for 5 minutes. They were instructed explicitly in post-operative care.   Botox LOT: [id number] EXP: 05/2024

## 2022-04-27 ENCOUNTER — Ambulatory Visit (INDEPENDENT_AMBULATORY_CARE_PROVIDER_SITE_OTHER): Payer: Self-pay | Admitting: Physician Assistant

## 2022-04-27 DIAGNOSIS — Z411 Encounter for cosmetic surgery: Secondary | ICD-10-CM

## 2022-10-26 NOTE — Progress Notes (Signed)
Botulinum Toxin Procedure Note   Procedure: Cosmetic botulinum toxin   Pre-operative Diagnosis: Dynamic rhytides   Post-operative Diagnosis: Same   Complications:  None   Brief history: The patient desires botulinum toxin injection.  They are aware of the risks including bleeding, damage to deeper structures, asymmetry, brow ptosis, eyelid ptosis, bruising. The patient understands and wishes to proceed.  He was last seen here in clinic on 04/27/2022.  At that time, static right sides noted to frontalis as well as slight static right side cynical bowel region.  36 units administered in total.  2 injection points for the crows feet, 5 point V-shaped glabellar/procerus injection, and 2 rows for the forehead.  See images.  Today, he tells me that he was quite pleased with the outcome of his last Botox session and does not want to change anything.  He wants the same dose in the same spots.  He denies any other issues or concerns.  He denied any significant bruising or headache after the most recent encounter.  He plans to continue coming every 6 months.   Procedure: The area was prepped with alcohol and dried with a clean gauze.  Using a clean technique the botulinum toxin was diluted with 2.5 mL of bacteriostatic saline per 100 unit vial which resulted in 4 units per 0.1 mL.   Subsequently the mixture was injected in the glabellar, forehead area with preservation of the temporal branch to the lateral eyebrow. A total of 36 Units of botulinum toxin was used. The forehead, glabella and crows feet were prepped and injected in a standard injection pattern using 2 injection points for the crows feet, 5-point V-shape glabellar/procerus injection, and 2 rows for the forehead.  They tolerated this fine.      No complications were noted. Light pressure was held for 5 minutes. They were instructed explicitly in post-operative care.   Botox LOT: [id number] EXP: 09/2024

## 2022-10-27 ENCOUNTER — Ambulatory Visit (INDEPENDENT_AMBULATORY_CARE_PROVIDER_SITE_OTHER): Payer: BLUE CROSS/BLUE SHIELD | Admitting: Physician Assistant

## 2022-10-27 DIAGNOSIS — Z411 Encounter for cosmetic surgery: Secondary | ICD-10-CM

## 2023-11-09 ENCOUNTER — Ambulatory Visit (INDEPENDENT_AMBULATORY_CARE_PROVIDER_SITE_OTHER): Payer: Self-pay

## 2023-11-09 VITALS — BP 106/67 | HR 76 | Ht 66.0 in | Wt 184.2 lb

## 2023-11-09 DIAGNOSIS — L988 Other specified disorders of the skin and subcutaneous tissue: Secondary | ICD-10-CM

## 2023-11-09 NOTE — Progress Notes (Signed)
 Botulinum Toxin and Filler Injection Procedure Note  Procedure: Cosmetic botulinum toxin   Pre-operative Diagnosis: Dynamic rhytides   Post-operative Diagnosis: Same  Complications:  None  Brief history: The patient desires botulinum toxin injection of her forehead. I discussed with the patient this proposed procedure of botulinum toxin injections, which is customized depending on the particular needs of the patient. It is performed on facial rhytids as a temporary correction. The alternatives were discussed with the patient. The risks were addressed including bleeding, scarring, infection, damage to deeper structures, asymmetry, and chronic pain, which may occur infrequently after a procedure. The individual's choice to undergo a surgical procedure is based on the comparison of risks to potential benefits. Other risks include unsatisfactory results, brow ptosis, eyelid ptosis, allergic reaction, temporary paralysis, which should go away with time, bruising, blurring disturbances and delayed healing. Botulinum toxin injections do not arrest the aging process or produce permanent tightening of the eyelid.  Operative intervention maybe necessary to maintain the results of a blepharoplasty or botulinum toxin. The patient understands and wishes to proceed.  Procedure: The area was prepped with alcohol and dried with a clean gauze. Using a clean technique, the botulinum toxin was diluted with 2.5 cc (100 unit vial) of preservative-free normal saline which was slowly injected with an 18 gauge needle in a tuberculin syringes.  A 32 gauge needles were then used to inject the botulinum toxin. This mixture allow for an aliquot of 4 units per 0.1 cc in each injection site.    Subsequently the mixture was injected in the glabellar, bilateral crows feet and forehead area with preservation of the temporal branch to the lateral eyebrow as well as into each lateral canthal area beginning from the lateral orbital rim  medial to the zygomaticus major in 3 separate areas. A total of 50 Units of botulinum toxin was used. The forehead, crowsfeet and glabellar area was injected with care to inject intramuscular only while holding pressure on the supratrochlear vessels in each area during each injection on either side of the medial corrugators. The injection proceeded vertically superiorly to the medial 2/3 of the frontalis muscle and superior 2/3 of the lateral frontalis, again with preservation of the frontal branch. No complications were noted. Light pressure was held for 5 minutes. He was instructed explicitly in post-operative care.  Botox LOT:  [id number]  EXP:  2027/05  Nieves HERO. Loralee Weitzman, MD [facility]

## 2023-11-26 ENCOUNTER — Telehealth: Payer: Self-pay

## 2023-11-26 NOTE — Telephone Encounter (Signed)
 Called to see if the pt could come in at 930 for proced and then f/u with Arleene, he is already on sch at 1030 to see [facility] 12-03-23

## 2023-12-03 ENCOUNTER — Ambulatory Visit (INDEPENDENT_AMBULATORY_CARE_PROVIDER_SITE_OTHER): Payer: Self-pay

## 2023-12-03 VITALS — BP 123/81 | HR 65

## 2023-12-03 DIAGNOSIS — L988 Other specified disorders of the skin and subcutaneous tissue: Secondary | ICD-10-CM

## 2023-12-03 DIAGNOSIS — Z719 Counseling, unspecified: Secondary | ICD-10-CM

## 2023-12-03 NOTE — Progress Notes (Addendum)
 Microneedling Procedure Report - Face  Patient Name: Robert Jordan  DOB: 11/08/1975 Date of Procedure: 12/03/2023 Provider Name: Nieves Client Location: In office procedure  Procedure:  Microneedling (Collagen Induction Therapy) to the face using a Digital Pop Deluxe w/ Micro M Needle  Indication: Patient presented with fine lines/wrinkles, acne scarring, enlarged pores, uneven skin texture Patient desires skin rejuvenation and collagen stimulation.  Pre-Treatment Preparation:  Informed consent obtained  Medical history reviewed -- no contraindications noted  Skin cleansed with antiseptic solution  Topical anesthetic applied and left for 20 minutes  Numbing cream removed; skin re-cleansed with sterile saline  Procedure Details: Parameter Details Device Used Digital Pop Deluxe Pen Micro M needle Needle Depth 0.5 mm to 1.5 mm tailored to each facial zone Number of Passes 2 passes per area Areas Treated Entire face, including: forehead, cheeks, nose, chin Technique Horizontal, vertical, and diagonal passes Additional Products Used ZO treatment  Skin Response Observed Erythema, pinpoint bleeding (normal)  Post-Treatment Care Provided: Skin cooled with sterile saline compress Post-procedure serum/moisturizer applied Broad-spectrum SPF recommended Patient advised on aftercare: Avoid makeup, sun exposure, and active skincare (retinol, acids) for 24-72 hours Use gentle cleanser and hydrating products Expect redness, tightness, mild swelling for 1-3 days  Nieves HERO. Tanique Matney, MD [facility]

## 2024-01-01 ENCOUNTER — Ambulatory Visit (INDEPENDENT_AMBULATORY_CARE_PROVIDER_SITE_OTHER): Payer: Self-pay

## 2024-01-01 VITALS — BP 129/75 | HR 60 | Wt 186.0 lb

## 2024-01-01 DIAGNOSIS — Z09 Encounter for follow-up examination after completed treatment for conditions other than malignant neoplasm: Secondary | ICD-10-CM

## 2024-01-01 MED ORDER — MINOXIDIL 2.5 MG PO TABS: 2.5000 mg | ORAL_TABLET | Freq: Every day | ORAL | 3 refills | Status: AC

## 2024-01-01 NOTE — Progress Notes (Signed)
 Established Patient Office Visit  Subjective   Patient ID: Robert Jordan, male    DOB: 1975/04/26  Age: 48 y.o. MRN: [id number]  No chief complaint on file.   HPI  48 year old male who presents status post microneedling of the face.  Patient has been following my recommendations regarding skin care.  Patient presents today for follow-up, he is very pleased with the results.  He would like to try Moxi laser or another session of microneedling with PRP or a facial for maintenance.  He is also inquiring about medication for hair loss.    Objective:     BP 129/75   Pulse 60   Wt 186 lb (84.4 kg)   SpO2 99%   BMI 30.02 kg/m  BP Readings from Last 3 Encounters:  01/01/24 129/75  12/03/23 123/81  11/09/23 106/67      Physical Exam Facial skin Skin color is consistent with the patient's ethnicity and evenly pigmented. No lesions, rashes, scars, or discoloration noted. No evidence of acne, comedones, pustules, or papules. Skin is warm, well hydrated, and smooth to touch. No tenderness, induration, nodules, or masses.  No results found for any visits on 01/01/24.  Last CBC Lab Results  Component Value Date   WBC 4.1 07/25/2017   HGB 15.6 07/25/2017   HCT 44.5 07/25/2017   MCV 83 07/25/2017   MCH 29.1 07/25/2017   RDW 14.5 07/25/2017   PLT 218 07/25/2017      The ASCVD Risk score (Arnett DK, et al., 2019) failed to calculate for the following reasons:   Cannot find a previous HDL lab   Cannot find a previous total cholesterol lab    Assessment & Plan:   Problem List Items Addressed This Visit   None Visit Diagnoses       Follow-up exam    -  Primary      Patient is very pleased with his microneedling and facial results.  He is considering repeating the treatment in a month.  We also discussed Moxi laser risks and benefits today.The usual postoperative course was stressed. Risks include pain, scarring, pigment change and recalcitrant rhytids. It may take  several sessions to see a meaningful result and I explained to her the limitations of this lases since its a non-ablative laser.  Patient will check his schedule and make an informed decision about the cosmetic procedure that he wants to undergo.  I explained to him that both procedures cannot be done at the same time and we will have to wait at least a month between them.  I explained to the patient that we do not have PRP in our institution yet.  It may be an option in the near future.  Based on hair thinning, I recommend starting a low dose of minoxidil 0.25 mg daily, which can help stimulate hair follicles to slow hair loss and promote new growth. This medication has good evidence for effectiveness in male hair thinning, though it may take 3 to 6 months to see results. The oral treatment is not FDA approved yet as the topical treatment is. Side effects are rare at this dose but can include dizziness or swelling, so I instructed the patient to let me know if she experience anything unusual. In addition, taking biotin supplements may support hair strength and improve texture; it's generally safe but we'll keep the dose moderate to avoid any interference with lab tests. Another option to consider is low-level laser therapy, which uses gentle laser light  to stimulate hair follicles. It's non-invasive and safe, with minimal side effects, but requires consistent use over time. Combining these treatments offers the best chance to slow hair loss and encourage regrowth. The patient has a good understanding of all the risks and benefits, therapy course and care. We obtained pictures to follow her progress. All questions were answered.  We also discussed the risks and benefits of finasteride 1 mg daily yet patient would like to defer this medication for now due to risk of erectile dysfunction.   The plan agreed upon includes: Minoxidil 0.25 mg daily, Biotin Supplements and Red light therapy.  Moxi laser versus  microneedling with facial treatment in 1 month.  Continue skin care as instructed.  Wania Longstreth M Khadeejah Castner, MD
